# Patient Record
Sex: Male | Born: 1960 | Race: Black or African American | Hispanic: No | Marital: Married | Smoking: Former smoker
Health system: Southern US, Community
[De-identification: ages and names within clinical notes are randomized; demographics above are authoritative.]

## PROBLEM LIST (undated history)

## (undated) DIAGNOSIS — E785 Hyperlipidemia, unspecified: Secondary | ICD-10-CM

## (undated) DIAGNOSIS — N529 Male erectile dysfunction, unspecified: Secondary | ICD-10-CM

## (undated) DIAGNOSIS — E291 Testicular hypofunction: Secondary | ICD-10-CM

## (undated) DIAGNOSIS — I10 Essential (primary) hypertension: Secondary | ICD-10-CM

## (undated) DIAGNOSIS — R351 Nocturia: Secondary | ICD-10-CM

## (undated) DIAGNOSIS — E8881 Metabolic syndrome: Secondary | ICD-10-CM

## (undated) DIAGNOSIS — R972 Elevated prostate specific antigen [PSA]: Secondary | ICD-10-CM

## (undated) DIAGNOSIS — N400 Enlarged prostate without lower urinary tract symptoms: Secondary | ICD-10-CM

## (undated) HISTORY — DX: Metabolic syndrome: E88.810

## (undated) HISTORY — DX: Testicular hypofunction: E29.1

## (undated) HISTORY — DX: Male erectile dysfunction, unspecified: N52.9

## (undated) HISTORY — DX: Hyperlipidemia, unspecified: E78.5

## (undated) HISTORY — PX: VASECTOMY: SHX75

## (undated) HISTORY — DX: Metabolic syndrome: E88.81

## (undated) HISTORY — DX: Benign prostatic hyperplasia without lower urinary tract symptoms: N40.0

## (undated) HISTORY — DX: Essential (primary) hypertension: I10

## (undated) HISTORY — PX: OTHER SURGICAL HISTORY: SHX169

## (undated) HISTORY — PX: FOOT SURGERY: SHX648

## (undated) HISTORY — DX: Nocturia: R35.1

## (undated) HISTORY — DX: Elevated prostate specific antigen (PSA): R97.20

---

## 2009-02-26 ENCOUNTER — Ambulatory Visit: Payer: Self-pay | Admitting: General Surgery

## 2010-10-21 LAB — HM COLONOSCOPY: HM Colonoscopy: NORMAL

## 2011-01-26 IMAGING — CT CT ABD-PELV W/ CM
1 of 3 series · 12 of 32 positions shown, 18 images · non-contrast
Comparison: none

REASON FOR EXAM: LLQ ABD PAIN
COMMENTS:

[Series 2: abdomen · axial · 0.70mm/px · z∈[-1049,-674]mm · 12 of 89 slices shown, 18 images]
[im 7/89  soft-tissue]
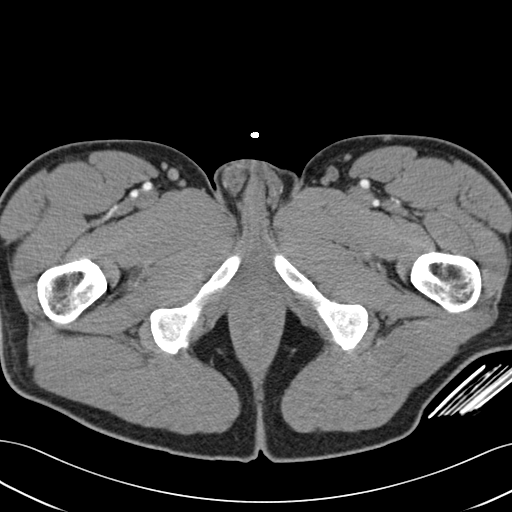
[im 7/89  bone]
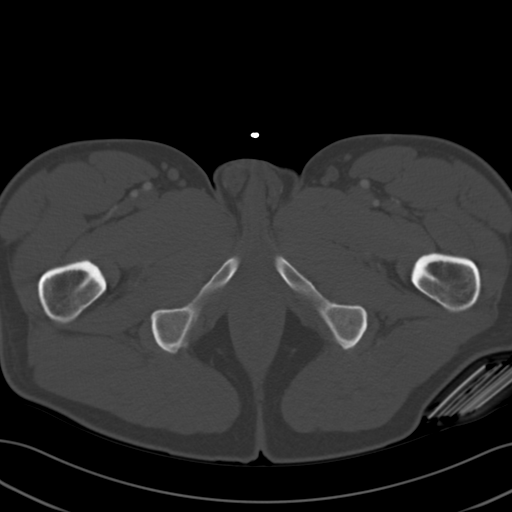
[im 14/89  soft-tissue]
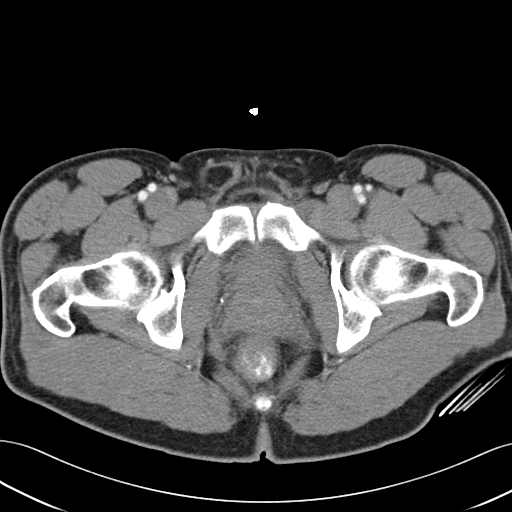
[im 21/89  soft-tissue]
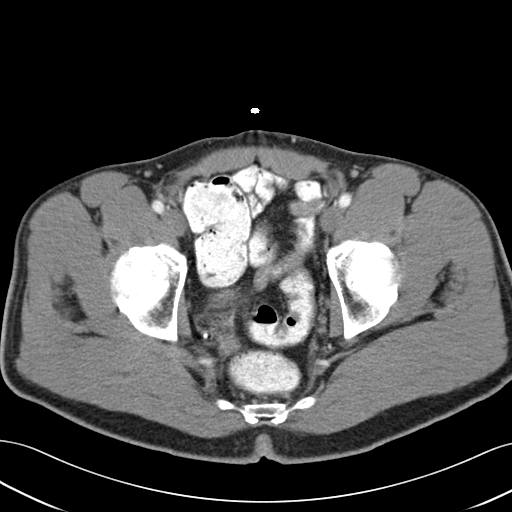
[im 28/89  soft-tissue]
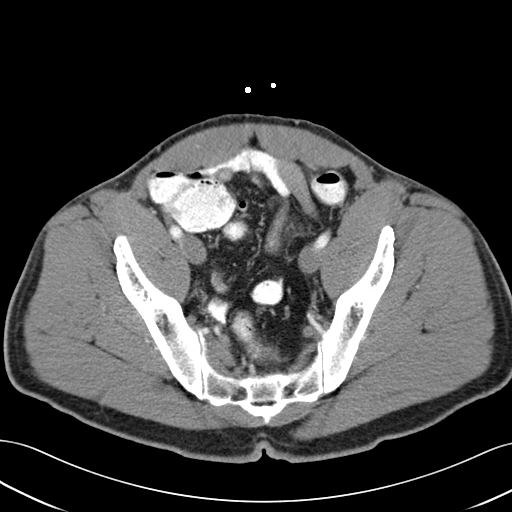
[im 34/89  soft-tissue]
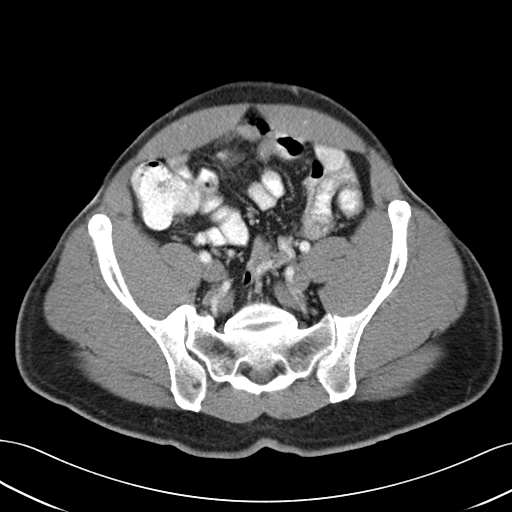
[im 41/89  soft-tissue]
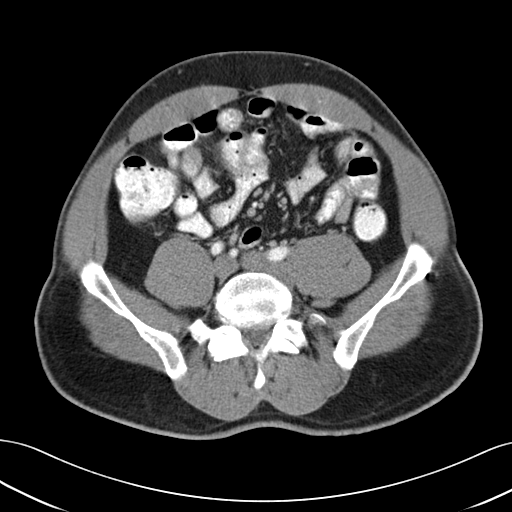
[im 48/89  soft-tissue]
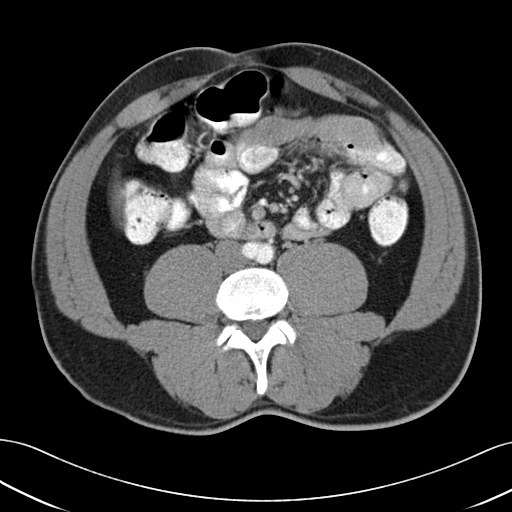
[im 55/89  soft-tissue]
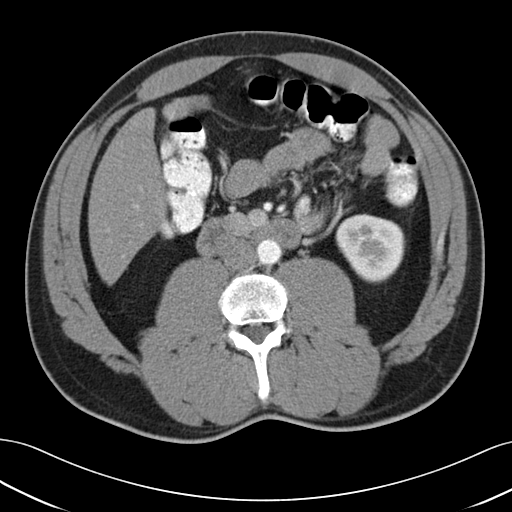
[im 61/89  soft-tissue]
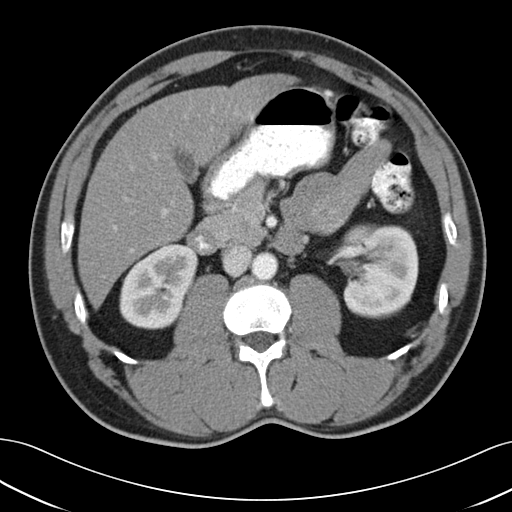
[im 61/89  lung]
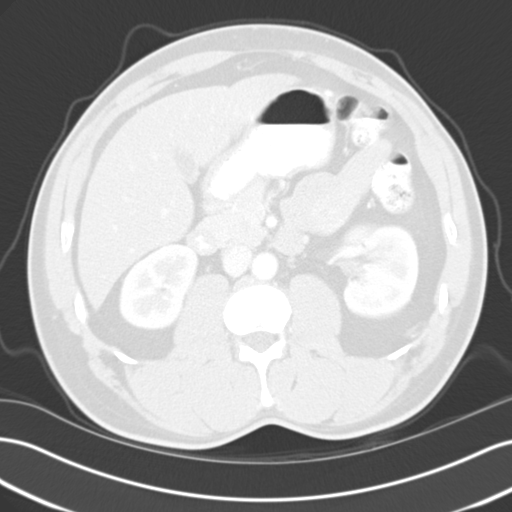
[im 61/89  bone]
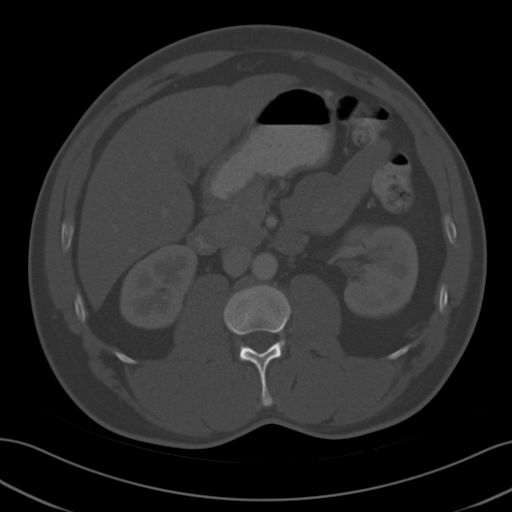
[im 68/89  soft-tissue]
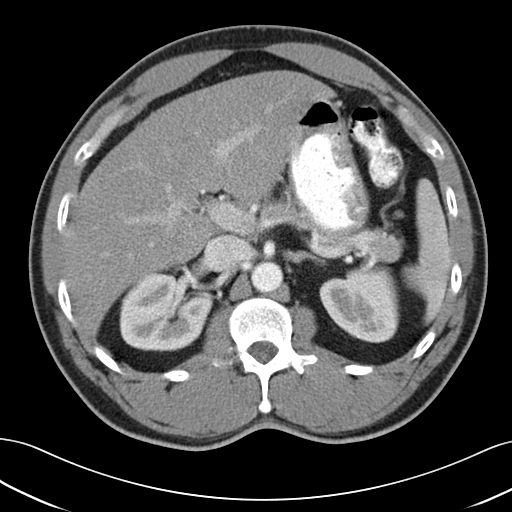
[im 68/89  lung]
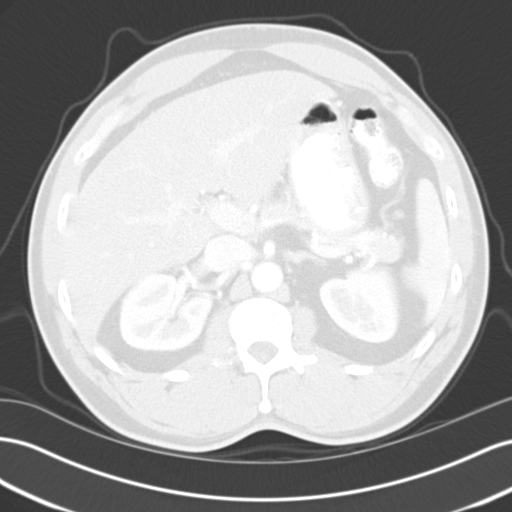
[im 75/89  soft-tissue]
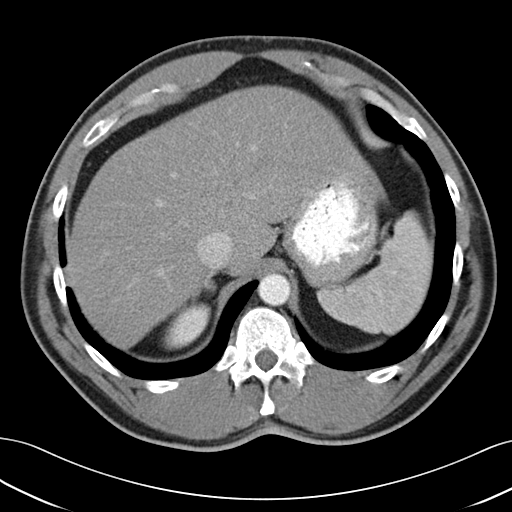
[im 75/89  lung]
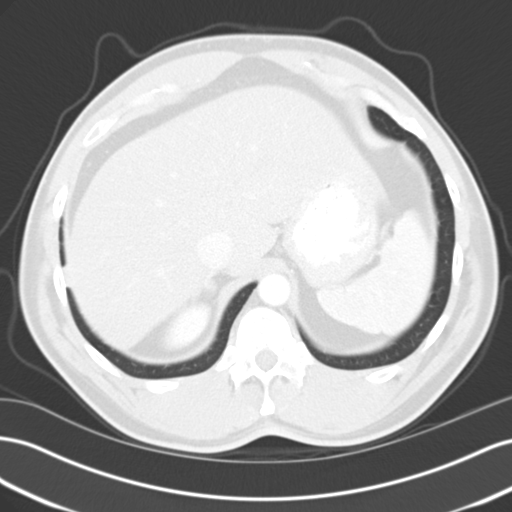
[im 82/89  soft-tissue]
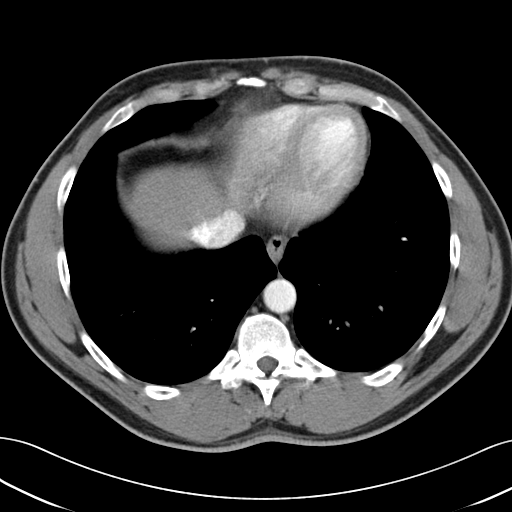
[im 82/89  lung]
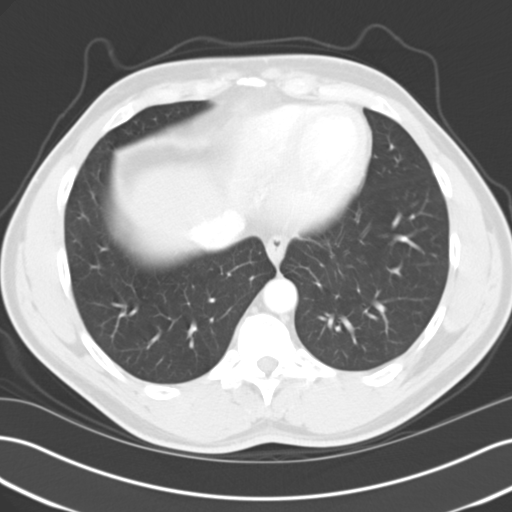

[12 of 32 positions shown; findings below may reference images not displayed]

PROCEDURE:     CT  - CT ABDOMEN / PELVIS  W  - February 26, 2009  [DATE]

RESULT:     Axial CT scanning was performed through the abdomen and pelvis
at 5 mm intervals and slice thicknesses following intravenous administration
of 100 cc of Bsovue-Y95 as well as oral contrast material. Immediate and
delayed images were acquired. Review of 3-dimensional reconstructed images
was separately on the webspace server monitor.

The liver exhibits no ductal dilation or focal mass. There are 5 mm diameter
hypodensities in the right lobe and one in the left lobe seen best on image
21 compatible with cysts. The pancreas, spleen, partially distended stomach,
adrenal glands, and kidneys are normal in appearance. The caliber of the
abdominal aorta is normal. There is no periaortic nor pericaval
lymphadenopathy. The partially distended urinary bladder is normal in
appearance. The prostate gland is mildly enlarged and produces a mild
impression upon the urinary bladder base. The partially distended loops of
small and large bowel exhibit no acute abnormality. There is no evidence of
ascites. The lung bases are clear. The lumbar vertebral bodies are preserved
in height.
IMPRESSION: 1. I do not see evidence of acute diverticulitis nor other acute bowel
abnormality. There is a broadly necked ventral hernia containing fat in the
anterior aspect of a loop of transverse colon but I do not see findings
suspicious for incarceration.
2. I do not see evidence of acute hepatobiliary abnormality nor acute
urinary tract abnormality.
3. There is no evidence of an intra-abdominal or pelvic abscess nor
lymphadenopathy.

## 2013-06-13 LAB — PSA: PSA: 2

## 2013-09-03 ENCOUNTER — Ambulatory Visit: Payer: Self-pay | Admitting: Family Medicine

## 2013-09-03 LAB — BASIC METABOLIC PANEL: Glucose: 94 mg/dL

## 2013-09-03 LAB — HEMOGLOBIN A1C: HEMOGLOBIN A1C: 6.1 % — AB (ref 4.0–6.0)

## 2013-09-14 ENCOUNTER — Ambulatory Visit: Payer: Self-pay | Admitting: Family Medicine

## 2014-01-01 LAB — LIPID PANEL
Cholesterol: 186 mg/dL (ref 0–200)
HDL: 54 mg/dL (ref 35–70)
LDL Cholesterol: 117 mg/dL
Triglycerides: 76 mg/dL (ref 40–160)

## 2014-08-13 ENCOUNTER — Encounter: Payer: Self-pay | Admitting: Family Medicine

## 2014-08-13 ENCOUNTER — Encounter (INDEPENDENT_AMBULATORY_CARE_PROVIDER_SITE_OTHER): Payer: Self-pay

## 2014-08-13 ENCOUNTER — Ambulatory Visit (INDEPENDENT_AMBULATORY_CARE_PROVIDER_SITE_OTHER): Payer: BLUE CROSS/BLUE SHIELD | Admitting: Family Medicine

## 2014-08-13 VITALS — BP 134/76 | HR 62 | Temp 97.9°F | Resp 18 | Ht 69.0 in | Wt 186.0 lb

## 2014-08-13 DIAGNOSIS — E785 Hyperlipidemia, unspecified: Secondary | ICD-10-CM | POA: Insufficient documentation

## 2014-08-13 DIAGNOSIS — B351 Tinea unguium: Secondary | ICD-10-CM | POA: Insufficient documentation

## 2014-08-13 DIAGNOSIS — I10 Essential (primary) hypertension: Secondary | ICD-10-CM | POA: Diagnosis not present

## 2014-08-13 DIAGNOSIS — R739 Hyperglycemia, unspecified: Secondary | ICD-10-CM

## 2014-08-13 DIAGNOSIS — R7309 Other abnormal glucose: Secondary | ICD-10-CM

## 2014-08-13 LAB — GLUCOSE, POCT (MANUAL RESULT ENTRY): POC Glucose: 86 mg/dl (ref 70–99)

## 2014-08-13 MED ORDER — TERBINAFINE HCL 250 MG PO TABS: 250.0000 mg | ORAL_TABLET | Freq: Every day | ORAL | Status: DC

## 2014-08-13 MED ORDER — HYDROCHLOROTHIAZIDE 12.5 MG PO TABS: 12.5000 mg | ORAL_TABLET | Freq: Once | ORAL | Status: DC

## 2014-08-13 MED ORDER — AMLODIPINE-OLMESARTAN 10-40 MG PO TABS: 1.0000 | ORAL_TABLET | Freq: Once | ORAL | Status: DC

## 2014-08-13 NOTE — Addendum Note (Signed)
Addended by: Lilibeth Opie, Ulla Potash on: 08/13/2014 08:07 AM   Modules accepted: Orders

## 2014-08-13 NOTE — Patient Instructions (Signed)
F/u in 3 mo

## 2014-08-13 NOTE — Progress Notes (Signed)
Name: Devon Erickson   MRN: [id number]    DOB: 17-Jul-1960   Date:08/13/2014       Progress Note  Subjective  Chief Complaint  Chief Complaint  Patient presents with  . Hypertension    Hypertension This is a chronic problem. The current episode started more than 1 year ago. The problem is unchanged. The problem is controlled. Associated symptoms include anxiety. Pertinent negatives include no blurred vision, chest pain, headaches, neck pain, orthopnea, palpitations or shortness of breath. There are no associated agents to hypertension. Risk factors for coronary artery disease include male gender. Past treatments include angiotensin blockers, diuretics and calcium channel blockers. The current treatment provides moderate improvement. There are no compliance problems.  Hypertensive end-organ damage includes PVD. There is no history of angina or kidney disease.   TINEA UNGUIM Patient complains of an irritated area on his left thumbnail on the lateral aspect.. There's been no drainage and no bleeding and no history of any trauma. He does not complaining of any toenail involvement.   Past Medical History  Diagnosis Date  . Hypertension   . Hyperlipidemia   . Elevated PSA   . Metabolic syndrome     History  Substance Use Topics  . Smoking status: Former Research scientist (life sciences)  . Smokeless tobacco: Not on file  . Alcohol Use: No     Current outpatient prescriptions:  .  amLODipine-olmesartan (AZOR) 10-40 MG per tablet, Take 1 tablet by mouth., Disp: , Rfl:  .  CIALIS 20 MG tablet, Take 20 mg by mouth as needed., Disp: , Rfl: 12 .  finasteride (PROSCAR) 5 MG tablet, , Disp: , Rfl:  .  fluticasone (FLONASE) 50 MCG/ACT nasal spray, Place 1 spray into the nose., Disp: , Rfl:  .  hydrochlorothiazide (HYDRODIURIL) 12.5 MG tablet, Take 1 tablet by mouth., Disp: , Rfl:   No Known Allergies  Review of Systems  Constitutional: Negative for fever, chills and weight loss.  HENT: Negative for congestion,  hearing loss, sore throat and tinnitus.   Eyes: Negative for blurred vision, double vision and redness.  Respiratory: Negative for cough, hemoptysis and shortness of breath.   Cardiovascular: Negative for chest pain, palpitations, orthopnea, claudication and leg swelling.  Gastrointestinal: Negative for heartburn, nausea, vomiting, diarrhea, constipation and blood in stool.  Genitourinary: Negative for dysuria, urgency, frequency and hematuria.  Musculoskeletal: Negative for myalgias, back pain, joint pain, falls and neck pain.  Skin: Negative for itching.       Inflammation and discomfort in the left thumbnail  Neurological: Negative for dizziness, tingling, tremors, focal weakness, seizures, loss of consciousness, weakness and headaches.  Endo/Heme/Allergies: Does not bruise/bleed easily.  Psychiatric/Behavioral: Negative for depression and substance abuse. The patient is not nervous/anxious and does not have insomnia.      Objective  Filed Vitals:   08/13/14 0734  BP: 134/76  Pulse: 62  Temp: 97.9 F (36.6 C)  TempSrc: Oral  Resp: 18  Height: 5\' 9"  (1.753 m)  Weight: 186 lb (84.369 kg)  SpO2: 97%     Physical Exam  Constitutional: He is oriented to person, place, and time and well-developed, well-nourished, and in no distress.  HENT:  Head: Normocephalic.  Eyes: EOM are normal. Pupils are equal, round, and reactive to light.  Neck: Normal range of motion. Neck supple. No thyromegaly present.  Cardiovascular: Normal rate, regular rhythm and normal heart sounds.   No murmur heard. Pulmonary/Chest: Effort normal and breath sounds normal. No respiratory distress. He has no wheezes.  Abdominal: Soft. Bowel sounds are normal.  Musculoskeletal: Normal range of motion. He exhibits no edema.  Lymphadenopathy:    He has no cervical adenopathy.  Neurological: He is alert and oriented to person, place, and time. No cranial nerve deficit. Gait normal. Coordination normal.  Skin:  Skin is warm and dry. No rash noted.  Thickened scaly aspect of the lateral aspect of the right thumb. This is consistent with a fungal infection.  Psychiatric: Affect and judgment normal.      Assessment & Plan  1. Essential hypertension Well-controlled - POCT Glucose (CBG)  2. Elevated blood sugar Well-controlled - POCT Glucose (CBG)  3. Tinea unguium We'll recheck again in 3 months and repeat another 3 months if not significantly improved - terbinafine (LAMISIL) 250 MG tablet; Take 1 tablet (250 mg total) by mouth daily.  Dispense: 30 tablet; Refill: 2

## 2014-11-14 ENCOUNTER — Ambulatory Visit (INDEPENDENT_AMBULATORY_CARE_PROVIDER_SITE_OTHER): Payer: BLUE CROSS/BLUE SHIELD | Admitting: Family Medicine

## 2014-11-14 ENCOUNTER — Encounter: Payer: Self-pay | Admitting: Family Medicine

## 2014-11-14 VITALS — BP 132/78 | HR 68 | Temp 97.7°F | Resp 16 | Ht 69.0 in | Wt 196.3 lb

## 2014-11-14 DIAGNOSIS — I10 Essential (primary) hypertension: Secondary | ICD-10-CM

## 2014-11-14 DIAGNOSIS — N400 Enlarged prostate without lower urinary tract symptoms: Secondary | ICD-10-CM

## 2014-11-14 MED ORDER — AMLODIPINE-OLMESARTAN 10-40 MG PO TABS: 1.0000 | ORAL_TABLET | Freq: Every day | ORAL | Status: DC

## 2014-11-14 MED ORDER — HYDROCHLOROTHIAZIDE 12.5 MG PO TABS: 12.5000 mg | ORAL_TABLET | Freq: Once | ORAL | Status: DC

## 2014-11-14 NOTE — Progress Notes (Signed)
Name: Devon Erickson   MRN: [id number]    DOB: 1960/04/30   Date:11/14/2014       Progress Note  Subjective  Chief Complaint  Chief Complaint  Patient presents with  . Hypertension    pt here for 3 month follow up  . Hyperlipidemia  . Hyperglycemia    HPI  Hypertension   Patient presents for follow-up of hypertension. It has been present for over 5  years.  Patient states that there is compliance with medical regimen which consists of amlodipine with hydrochlorothiazide and olmesartan. . There is no end organ disease. Cardiac risk factors include hypertension hyperlipidemia and diabetes.  Exercise regimen consist of some walking .  Diet consist of some sodium restriction. Patient .  BPH  Patient has history of BPH for which he is followed by urologist. From his account apparently he is on Proscar on a regular basis. He still has some problems with erections which urologist is following.  Past Medical History  Diagnosis Date  . Hypertension   . Hyperlipidemia   . Elevated PSA   . Metabolic syndrome     Social History  Substance Use Topics  . Smoking status: Former Research scientist (life sciences)  . Smokeless tobacco: Not on file  . Alcohol Use: No     Current outpatient prescriptions:  .  amLODipine-olmesartan (AZOR) 10-40 MG per tablet, Take 1 tablet by mouth once., Disp: 90 tablet, Rfl: 1 .  CIALIS 20 MG tablet, Take 20 mg by mouth as needed., Disp: , Rfl: 12 .  finasteride (PROSCAR) 5 MG tablet, , Disp: , Rfl:  .  hydrochlorothiazide (HYDRODIURIL) 12.5 MG tablet, Take 1 tablet (12.5 mg total) by mouth once., Disp: 90 tablet, Rfl: 1 .  terbinafine (LAMISIL) 250 MG tablet, Take 1 tablet (250 mg total) by mouth daily., Disp: 30 tablet, Rfl: 2 .  amLODipine-olmesartan (AZOR) 10-40 MG per tablet, Take 1 tablet by mouth daily., Disp: 90 tablet, Rfl: 1 .  fluticasone (FLONASE) 50 MCG/ACT nasal spray, Place 1 spray into the nose., Disp: , Rfl:   No Known Allergies  Review of Systems   Constitutional: Negative for fever, chills and weight loss.  HENT: Negative for congestion, hearing loss, sore throat and tinnitus.   Eyes: Negative for blurred vision, double vision and redness.  Respiratory: Negative for cough, hemoptysis and shortness of breath.   Cardiovascular: Negative for chest pain, palpitations, orthopnea, claudication and leg swelling.  Gastrointestinal: Negative for heartburn, nausea, vomiting, diarrhea, constipation and blood in stool.  Genitourinary: Positive for urgency and frequency. Negative for dysuria and hematuria.       Some ADD symptoms  Musculoskeletal: Negative for myalgias, back pain, joint pain, falls and neck pain.  Skin: Negative for itching.  Neurological: Negative for dizziness, tingling, tremors, focal weakness, seizures, loss of consciousness, weakness and headaches.  Endo/Heme/Allergies: Does not bruise/bleed easily.  Psychiatric/Behavioral: Negative for depression and substance abuse. The patient is not nervous/anxious and does not have insomnia.      Objective  Filed Vitals:   11/14/14 0740  BP: 132/78  Pulse: 68  Temp: 97.7 F (36.5 C)  Resp: 16  Height: 5\' 9"  (1.753 m)  Weight: 196 lb 5 oz (89.047 kg)  SpO2: 95%     Physical Exam  Constitutional: He is oriented to person, place, and time and well-developed, well-nourished, and in no distress.  HENT:  Head: Normocephalic.  Eyes: EOM are normal. Pupils are equal, round, and reactive to light.  Neck: Normal range of motion.  Neck supple. No thyromegaly present.  Cardiovascular: Normal rate, regular rhythm and normal heart sounds.   No murmur heard. Pulmonary/Chest: Effort normal and breath sounds normal. No respiratory distress. He has no wheezes.  Musculoskeletal: Normal range of motion. He exhibits no edema.  Lymphadenopathy:    He has no cervical adenopathy.  Neurological: He is alert and oriented to person, place, and time. No cranial nerve deficit. Gait normal.  Coordination normal.  Skin: Skin is warm and dry. No rash noted.  Psychiatric: Affect and judgment normal.      Assessment & Plan  1. Essential hypertension Well-controlled and 81 mg aspirin once daily  2. BPH (benign prostatic hyperplasia) Followed by urologist

## 2014-11-25 ENCOUNTER — Other Ambulatory Visit: Payer: BLUE CROSS/BLUE SHIELD

## 2014-11-25 DIAGNOSIS — R972 Elevated prostate specific antigen [PSA]: Secondary | ICD-10-CM

## 2014-11-26 LAB — PSA: Prostate Specific Ag, Serum: 1.5 ng/mL (ref 0.0–4.0)

## 2014-12-02 ENCOUNTER — Ambulatory Visit (INDEPENDENT_AMBULATORY_CARE_PROVIDER_SITE_OTHER): Payer: BLUE CROSS/BLUE SHIELD | Admitting: Urology

## 2014-12-02 ENCOUNTER — Encounter: Payer: Self-pay | Admitting: *Deleted

## 2014-12-02 VITALS — BP 148/88 | HR 58 | Ht 68.0 in | Wt 194.2 lb

## 2014-12-02 DIAGNOSIS — N529 Male erectile dysfunction, unspecified: Secondary | ICD-10-CM | POA: Insufficient documentation

## 2014-12-02 DIAGNOSIS — N401 Enlarged prostate with lower urinary tract symptoms: Secondary | ICD-10-CM | POA: Diagnosis not present

## 2014-12-02 DIAGNOSIS — N528 Other male erectile dysfunction: Secondary | ICD-10-CM

## 2014-12-02 DIAGNOSIS — N138 Other obstructive and reflux uropathy: Secondary | ICD-10-CM | POA: Insufficient documentation

## 2014-12-02 DIAGNOSIS — Z87898 Personal history of other specified conditions: Secondary | ICD-10-CM | POA: Diagnosis not present

## 2014-12-02 MED ORDER — FINASTERIDE 5 MG PO TABS: 5.0000 mg | ORAL_TABLET | Freq: Every day | ORAL | Status: DC

## 2014-12-02 NOTE — Progress Notes (Signed)
12/02/2014 9:25 AM   Devon Erickson Mar 15, 1960 [id number]  Referring provider: Ashok Norris, MD [address] [place], [place] [address]  Chief Complaint  Patient presents with  . Benign Prostatic Hypertrophy    6 month check up  . Erectile Dysfunction    6 month follow up   . Elevated PSA    bph    HPI: Patient is a 54 year old African-American male with a history of elevated PSA, erectile dysfunction and BPH with LUTS who presents today for 6 month follow-up.  History of elevated PSA Patient was found a PSA of 10.3 ng/mL on 03/11/2012. He underwent a biopsy at that time which was benign. His most recent PSA was 1.5 ng/mL on 11/25/2014.  Erectile dysfunction His SHIM score is 17, which is mild ED.   He has been having difficulty with erections for 3 years.   His major complaint is maintaining an erection.  His libido is preserved.   His risk factors for ED are age, BPH, working third shift and HTN.  He denies any painful erections or curvatures with his erections.   He has tried Cialis in the past, but he finds getting some good rest works just as well.      SHIM      12/02/14 0914       SHIM: Over the last 6 months:   How do you rate your confidence that you could get and keep an erection? Low     When you had erections with sexual stimulation, how often were your erections hard enough for penetration (entering your partner)? Sometimes (about half the time)     During sexual intercourse, how often were you able to maintain your erection after you had penetrated (entered) your partner? Difficult     During sexual intercourse, how difficult was it to maintain your erection to completion of intercourse? Slightly Difficult     When you attempted sexual intercourse, how often was it satisfactory for you? Not Difficult     SHIM Total Score   SHIM 17        Score: 1-7 Severe ED 8-11 Moderate ED 12-16 Mild-Moderate ED 17-21 Mild ED 22-25 No ED   BPH  WITH LUTS His IPSS score today is 6, which is mild lower urinary tract symptomatology. He is pleased with his quality life due to his urinary symptoms. He denies any dysuria, hematuria or suprapubic pain.  He also denies any recent fevers, chills, nausea or vomiting.   He does not have a family history of PCa.      IPSS      12/02/14 0900       International Prostate Symptom Score   How often have you had the sensation of not emptying your bladder? About half the time     How often have you had to urinate less than every two hours? Less than half the time     How often have you found you stopped and started again several times when you urinated? Less than 1 in 5 times     How often have you found it difficult to postpone urination? Not at All     How often have you had a weak urinary stream? Not at All     How often have you had to strain to start urination? Not at All     How many times did you typically get up at night to urinate? None     Total IPSS Score  6     Quality of Life due to urinary symptoms   If you were to spend the rest of your life with your urinary condition just the way it is now how would you feel about that? Mixed        Score:  1-7 Mild 8-19 Moderate 20-35 Severe     PMH: Past Medical History  Diagnosis Date  . Hypertension   . Hyperlipidemia   . Elevated PSA   . Metabolic syndrome   . BPH (benign prostatic hyperplasia)   . Erectile dysfunction   . Nocturia   . Hypogonadism in male     Surgical History: Past Surgical History  Procedure Laterality Date  . Foot surgery Left   . Left leg surgery    . Vasectomy      Home Medications:    Medication List       This list is accurate as of: 12/02/14  9:25 AM.  Always use your most recent med list.               amLODipine-olmesartan 10-40 MG tablet  Commonly known as:  AZOR  Take 1 tablet by mouth once.     amLODipine-olmesartan 10-40 MG tablet  Commonly known as:  AZOR  Take 1 tablet  by mouth daily.     finasteride 5 MG tablet  Commonly known as:  PROSCAR  Take 1 tablet (5 mg total) by mouth daily.     fluticasone 50 MCG/ACT nasal spray  Commonly known as:  FLONASE  Place 1 spray into the nose.     hydrochlorothiazide 12.5 MG tablet  Commonly known as:  HYDRODIURIL  Take 1 tablet (12.5 mg total) by mouth once.     terbinafine 250 MG tablet  Commonly known as:  LAMISIL  Take 1 tablet (250 mg total) by mouth daily.        Allergies: No Known Allergies  Family History: Family History  Problem Relation Age of Onset  . Prostate cancer Neg Hx   . Bladder Cancer Neg Hx     Social History:  reports that he has quit smoking. He does not have any smokeless tobacco history on file. He reports that he does not drink alcohol or use illicit drugs.  ROS: UROLOGY Frequent Urination?: No Hard to postpone urination?: No Burning/pain with urination?: No Get up at night to urinate?: No Leakage of urine?: No Urine stream starts and stops?: No Trouble starting stream?: No Do you have to strain to urinate?: No Blood in urine?: No Urinary tract infection?: No Sexually transmitted disease?: No Injury to kidneys or bladder?: No Painful intercourse?: No Weak stream?: No Erection problems?: No Penile pain?: No  Gastrointestinal Nausea?: No Vomiting?: No Indigestion/heartburn?: No Diarrhea?: No Constipation?: No  Constitutional Fever: No Night sweats?: No Weight loss?: No Fatigue?: No  Skin Skin rash/lesions?: No Itching?: No  Eyes Blurred vision?: No Double vision?: No  Ears/Nose/Throat Sore throat?: No Sinus problems?: No  Hematologic/Lymphatic Swollen glands?: No Easy bruising?: No  Cardiovascular Leg swelling?: No Chest pain?: No  Respiratory Cough?: No Shortness of breath?: No  Endocrine Excessive thirst?: No  Musculoskeletal Back pain?: No Joint pain?: No  Neurological Headaches?: No Dizziness?:  No  Psychologic Depression?: No Anxiety?: No  Physical Exam: BP 148/88 mmHg  Pulse 58  Ht 5\' 8"  (1.727 m)  Wt 194 lb 3.2 oz (88.089 kg)  BMI 29.54 kg/m2  GU: Patient with circumcised phallus.  Urethral meatus is patent.  No penile  discharge. No penile lesions or rashes. Scrotum without lesions, cysts, rashes and/or edema.  Testicles are located scrotally bilaterally. No masses are appreciated in the testicles. Left and right epididymis are normal. Rectal: Patient with  normal sphincter tone. Perineum without scarring or rashes. No rectal masses are appreciated. Prostate is approximately 50 grams, no nodules are appreciated. Seminal vesicles are normal.   Laboratory Data:  PSA History:  10.3 ng/mL on 01/13/2012  bx-neg on 03/09/2012  3.1 ng/mL on 06/15/2012  1.6 ng/mL on 12/15/2012  2.0 ng/mL on 06/13/2013  1.7 ng/mL on 12/24/2013  1.9 ng/mL on 06/13/2014  Lab Results  Component Value Date   PSA 1.5 11/25/2014   PSA 2.0 06/13/2013     Lab Results  Component Value Date   HGBA1C 6.1* 09/03/2013     Assessment & Plan:    1. History of elevated PSA:   Patient was found a PSA of 10.3 ng/mL on 03/11/2012. He underwent a biopsy at that time which was benign. His most recent PSA was 1.5 ng/mL on 11/25/2014.   Patient's PSA levels have reached a steady trend. We will increase visits to yearly at this time.  2. Erectile dysfunction:   Patient is finding that if he gets good rest, it is just as effective as the Cialis.  He desires no other treatment at this time.   3. BPH with LUTS:    Patient's IPSS score is 6/1.   His DRE demonstrates benign enlargement.  Patient would like to continue medical treatment.  I have sent a refill for the finasteride to his pharmacy.    He will follow up in 12 months for a PSA, DRE, PVR and an IPSS score.     Return in about 1 year (around 12/02/2015) for IPSS.  Zara Council, [facility] [address], [place], [place] [address] [phone]

## 2015-01-06 ENCOUNTER — Encounter: Payer: Self-pay | Admitting: Family Medicine

## 2015-01-06 ENCOUNTER — Ambulatory Visit (INDEPENDENT_AMBULATORY_CARE_PROVIDER_SITE_OTHER): Payer: BLUE CROSS/BLUE SHIELD | Admitting: Family Medicine

## 2015-01-06 VITALS — BP 138/80 | HR 78 | Temp 98.5°F | Resp 18 | Ht 68.0 in | Wt 196.6 lb

## 2015-01-06 DIAGNOSIS — Z0000 Encounter for general adult medical examination without abnormal findings: Secondary | ICD-10-CM | POA: Diagnosis not present

## 2015-01-06 NOTE — Progress Notes (Signed)
Name: Devon Erickson   MRN: [id number]    DOB: 04/26/1960   Date:01/06/2015       Progress Note  Subjective  Chief Complaint  Chief Complaint  Patient presents with  . Annual Exam    HPI  54 year old male presenting for annual H&P. Baseline problems are stable  Past Medical History  Diagnosis Date  . Hypertension   . Hyperlipidemia   . Elevated PSA   . Metabolic syndrome   . BPH (benign prostatic hyperplasia)   . Erectile dysfunction   . Nocturia   . Hypogonadism in male     Social History  Substance Use Topics  . Smoking status: Former Research scientist (life sciences)  . Smokeless tobacco: Not on file     Comment: quit 1996  . Alcohol Use: No     Current outpatient prescriptions:  .  amLODipine-olmesartan (AZOR) 10-40 MG per tablet, Take 1 tablet by mouth once., Disp: 90 tablet, Rfl: 1 .  amLODipine-olmesartan (AZOR) 10-40 MG per tablet, Take 1 tablet by mouth daily., Disp: 90 tablet, Rfl: 1 .  finasteride (PROSCAR) 5 MG tablet, Take 1 tablet (5 mg total) by mouth daily., Disp: 90 tablet, Rfl: 3 .  fluticasone (FLONASE) 50 MCG/ACT nasal spray, Place 1 spray into the nose., Disp: , Rfl:  .  hydrochlorothiazide (HYDRODIURIL) 12.5 MG tablet, Take 1 tablet (12.5 mg total) by mouth once., Disp: 90 tablet, Rfl: 1 .  terbinafine (LAMISIL) 250 MG tablet, Take 1 tablet (250 mg total) by mouth daily., Disp: 30 tablet, Rfl: 2  No Known Allergies  Review of Systems  Constitutional: Negative for fever, chills and weight loss.  HENT: Negative for congestion, hearing loss, sore throat and tinnitus.   Eyes: Negative for blurred vision, double vision and redness.  Respiratory: Negative for cough, hemoptysis and shortness of breath.   Cardiovascular: Negative for chest pain, palpitations, orthopnea, claudication and leg swelling.  Gastrointestinal: Negative for heartburn, nausea, vomiting, diarrhea, constipation and blood in stool.  Genitourinary: Positive for frequency. Negative for dysuria, urgency and  hematuria.       Erectile dysfunction  Musculoskeletal: Negative for myalgias, back pain, joint pain, falls and neck pain.  Skin: Negative for itching.  Neurological: Negative for dizziness, tingling, tremors, focal weakness, seizures, loss of consciousness, weakness and headaches.  Endo/Heme/Allergies: Does not bruise/bleed easily.  Psychiatric/Behavioral: Negative for depression and substance abuse. The patient is not nervous/anxious and does not have insomnia.      Objective  Filed Vitals:   01/06/15 0938  BP: 138/80  Pulse: 78  Temp: 98.5 F (36.9 C)  Resp: 18  Height: 5\' 8"  (1.727 m)  Weight: 196 lb 9 oz (89.16 kg)  SpO2: 97%     Physical Exam  Constitutional: He is oriented to person, place, and time and well-developed, well-nourished, and in no distress.  HENT:  Head: Normocephalic.  Eyes: EOM are normal. Pupils are equal, round, and reactive to light.  Neck: Normal range of motion. Neck supple. No thyromegaly present.  Cardiovascular: Normal rate, regular rhythm and normal heart sounds.   No murmur heard. Pulmonary/Chest: Effort normal and breath sounds normal. No respiratory distress. He has no wheezes.  Abdominal: Soft. Bowel sounds are normal.  Genitourinary:  Deferred to urologist  Musculoskeletal: Normal range of motion. He exhibits no edema.  Lymphadenopathy:    He has no cervical adenopathy.  Neurological: He is alert and oriented to person, place, and time. No cranial nerve deficit. Gait normal. Coordination normal.  Skin: Skin is warm  and dry. No rash noted.  Psychiatric: Affect and judgment normal.      Assessment & Plan  1. Annual physical exam  - CBC - Comprehensive Metabolic Panel (CMET) - Lipid Profile - TSH

## 2015-01-07 LAB — LIPID PANEL
Chol/HDL Ratio: 4.3 ratio units (ref 0.0–5.0)
Cholesterol, Total: 231 mg/dL — ABNORMAL HIGH (ref 100–199)
HDL: 54 mg/dL (ref >39)
LDL Calculated: 152 mg/dL — ABNORMAL HIGH (ref 0–99)
Triglycerides: 127 mg/dL (ref 0–149)
VLDL Cholesterol Cal: 25 mg/dL (ref 5–40)

## 2015-01-07 LAB — TSH: TSH: 1.76 u[IU]/mL (ref 0.450–4.500)

## 2015-01-07 LAB — COMPREHENSIVE METABOLIC PANEL
A/G RATIO: 1.6 (ref 1.1–2.5)
ALBUMIN: 4.6 g/dL (ref 3.5–5.5)
ALT: 31 IU/L (ref 0–44)
AST: 24 IU/L (ref 0–40)
Alkaline Phosphatase: 72 IU/L (ref 39–117)
BILIRUBIN TOTAL: 0.5 mg/dL (ref 0.0–1.2)
BUN / CREAT RATIO: 13 (ref 9–20)
BUN: 12 mg/dL (ref 6–24)
CALCIUM: 9.9 mg/dL (ref 8.7–10.2)
CHLORIDE: 94 mmol/L — AB (ref 97–106)
CO2: 28 mmol/L (ref 18–29)
Creatinine, Ser: 0.95 mg/dL (ref 0.76–1.27)
GFR calc Af Amer: 104 mL/min/{1.73_m2} (ref >59)
GFR calc non Af Amer: 90 mL/min/{1.73_m2} (ref >59)
GLOBULIN, TOTAL: 2.9 g/dL (ref 1.5–4.5)
Glucose: 92 mg/dL (ref 65–99)
POTASSIUM: 3.7 mmol/L (ref 3.5–5.2)
Sodium: 137 mmol/L (ref 136–144)
Total Protein: 7.5 g/dL (ref 6.0–8.5)

## 2015-01-07 LAB — CBC
HEMATOCRIT: 41 % (ref 37.5–51.0)
HEMOGLOBIN: 14 g/dL (ref 12.6–17.7)
MCH: 30.5 pg (ref 26.6–33.0)
MCHC: 34.1 g/dL (ref 31.5–35.7)
MCV: 89 fL (ref 79–97)
PLATELETS: 233 10*3/uL (ref 150–379)
RBC: 4.59 x10E6/uL (ref 4.14–5.80)
RDW: 14 % (ref 12.3–15.4)
WBC: 8.5 10*3/uL (ref 3.4–10.8)

## 2015-03-12 ENCOUNTER — Other Ambulatory Visit: Payer: Self-pay | Admitting: Family Medicine

## 2015-03-17 ENCOUNTER — Ambulatory Visit: Payer: BLUE CROSS/BLUE SHIELD | Admitting: Family Medicine

## 2015-04-11 ENCOUNTER — Other Ambulatory Visit: Payer: Self-pay | Admitting: Family Medicine

## 2015-04-11 NOTE — Telephone Encounter (Signed)
Pt needs refill on Azor 10/40 mg, Hydrochlorizide to be sent to [facility] [facility].

## 2015-04-14 MED ORDER — AMLODIPINE-OLMESARTAN 10-40 MG PO TABS: 1.0000 | ORAL_TABLET | Freq: Once | ORAL | Status: DC

## 2015-04-14 MED ORDER — HYDROCHLOROTHIAZIDE 12.5 MG PO TABS: 12.5000 mg | ORAL_TABLET | Freq: Once | ORAL | Status: DC

## 2015-04-14 MED ORDER — AMLODIPINE-OLMESARTAN 10-40 MG PO TABS: 1.0000 | ORAL_TABLET | Freq: Every day | ORAL | Status: DC

## 2015-04-14 NOTE — Addendum Note (Signed)
Addended by: Steele Sizer F on: 04/14/2015 05:10 PM   Modules accepted: Orders

## 2015-04-17 ENCOUNTER — Other Ambulatory Visit: Payer: Self-pay | Admitting: Urology

## 2015-04-17 DIAGNOSIS — N400 Enlarged prostate without lower urinary tract symptoms: Secondary | ICD-10-CM

## 2015-04-18 ENCOUNTER — Emergency Department: Payer: BLUE CROSS/BLUE SHIELD

## 2015-04-18 ENCOUNTER — Emergency Department
Admission: EM | Admit: 2015-04-18 | Discharge: 2015-04-18 | Disposition: A | Discharge status: Home / Self Care | Payer: BLUE CROSS/BLUE SHIELD | Attending: Emergency Medicine | Admitting: Emergency Medicine

## 2015-04-18 DIAGNOSIS — Z79899 Other long term (current) drug therapy: Secondary | ICD-10-CM | POA: Insufficient documentation

## 2015-04-18 DIAGNOSIS — Z87891 Personal history of nicotine dependence: Secondary | ICD-10-CM | POA: Diagnosis not present

## 2015-04-18 DIAGNOSIS — R079 Chest pain, unspecified: Secondary | ICD-10-CM | POA: Insufficient documentation

## 2015-04-18 DIAGNOSIS — I10 Essential (primary) hypertension: Secondary | ICD-10-CM | POA: Insufficient documentation

## 2015-04-18 LAB — CBC
HCT: 41.5 % (ref 40.0–52.0)
Hemoglobin: 13.7 g/dL (ref 13.0–18.0)
MCH: 30.1 pg (ref 26.0–34.0)
MCHC: 33 g/dL (ref 32.0–36.0)
MCV: 91.2 fL (ref 80.0–100.0)
PLATELETS: 205 10*3/uL (ref 150–440)
RBC: 4.55 MIL/uL (ref 4.40–5.90)
RDW: 13.7 % (ref 11.5–14.5)
WBC: 6.3 10*3/uL (ref 3.8–10.6)

## 2015-04-18 LAB — BASIC METABOLIC PANEL
Anion gap: 8 (ref 5–15)
BUN: 16 mg/dL (ref 6–20)
CO2: 23 mmol/L (ref 22–32)
Calcium: 8.4 mg/dL — ABNORMAL LOW (ref 8.9–10.3)
Chloride: 109 mmol/L (ref 101–111)
Creatinine, Ser: 0.85 mg/dL (ref 0.61–1.24)
GFR calc Af Amer: >60 mL/min (ref >60)
GLUCOSE: 103 mg/dL — AB (ref 65–99)
POTASSIUM: 3.7 mmol/L (ref 3.5–5.1)
Sodium: 140 mmol/L (ref 135–145)

## 2015-04-18 LAB — TROPONIN I
Troponin I: <0.03 ng/mL (ref <0.031)
Troponin I: <0.03 ng/mL (ref <0.031)

## 2015-04-18 NOTE — ED Provider Notes (Signed)
[facility] Emergency Department Provider Note  ____________________________________________  Time seen: Approximately 5:37 AM  I have reviewed the triage vital signs and the nursing notes.   HISTORY  Chief Complaint Chest Pain    HPI Devon Erickson is a 55 y.o. male who presents to the ED from home with a chief complaint of chest pain. Patient reports onset of central chest discomfort yesterday approximately 4 PM. Describes pain as "soreness" which is nonradiating and worsened when patient crosses his arms. States he has had a stressful week at work which involves lifting heavy objects. Denies recent fever, chills, shortness of breath, nausea, vomiting, diaphoresis, palpitations, diarrhea. Denies recent travel or trauma. Denies use of hormones.   Past Medical History  Diagnosis Date  . Hypertension   . Hyperlipidemia   . Elevated PSA   . Metabolic syndrome   . BPH (benign prostatic hyperplasia)   . Erectile dysfunction   . Nocturia   . Hypogonadism in male     Patient Active Problem List   Diagnosis Date Noted  . History of elevated PSA 12/02/2014  . BPH with obstruction/lower urinary tract symptoms 12/02/2014  . Erectile dysfunction of organic origin 12/02/2014  . Elevated blood sugar 08/13/2014  . HLD (hyperlipidemia) 08/13/2014  . BP (high blood pressure) 08/13/2014  . Tinea unguium 08/13/2014  . Allergic rhinitis, seasonal 08/04/2009    Past Surgical History  Procedure Laterality Date  . Foot surgery Left   . Left leg surgery    . Vasectomy      Current Outpatient Rx  Name  Route  Sig  Dispense  Refill  . amLODipine-olmesartan (AZOR) 10-40 MG tablet   Oral   Take 1 tablet by mouth once.   90 tablet   0   . finasteride (PROSCAR) 5 MG tablet      TAKE 1 TABLET BY MOUTH EVERY DAY   30 tablet   4   . hydrochlorothiazide (HYDRODIURIL) 12.5 MG tablet   Oral   Take 1 tablet (12.5 mg total) by mouth once.   90 tablet   0      Allergies Review of patient's allergies indicates no known allergies.  Family History  Problem Relation Age of Onset  . Prostate cancer Neg Hx   . Bladder Cancer Neg Hx   None for CAD   Social History Social History  Substance Use Topics  . Smoking status: Former Research scientist (life sciences)  . Smokeless tobacco: Not on file     Comment: quit 1996  . Alcohol Use: No    Review of Systems Constitutional: No fever/chills. Eyes: No visual changes. ENT: No sore throat. Cardiovascular: Positive for chest pain. Respiratory: Denies shortness of breath. Gastrointestinal: No abdominal pain.  No nausea, no vomiting.  No diarrhea.  No constipation. Genitourinary: Negative for dysuria. Musculoskeletal: Negative for back pain. Skin: Negative for rash. Neurological: Negative for headaches, focal weakness or numbness.  10-point ROS otherwise negative.  ____________________________________________   PHYSICAL EXAM:  VITAL SIGNS: ED Triage Vitals  Enc Vitals Group     BP 04/18/15 0349 158/89 mmHg     Pulse Rate 04/18/15 0349 60     Resp 04/18/15 0349 18     Temp 04/18/15 0349 97.8 F (36.6 C)     Temp src --      SpO2 04/18/15 0349 97 %     Weight 04/18/15 0349 210 lb (95.255 kg)     Height 04/18/15 0349 5\' 8"  (1.727 m)  Head Cir --      Peak Flow --      Pain Score 04/18/15 0350 2     Pain Loc --      Pain Edu? --      Excl. in [place]? --     Constitutional: Alert and oriented. Well appearing and in no acute distress. Eyes: Conjunctivae are normal. PERRL. EOMI. Head: Atraumatic. Nose: No congestion/rhinnorhea. Mouth/Throat: Mucous membranes are moist.  Oropharynx non-erythematous. Neck: No stridor.  No carotid bruits. Cardiovascular: Normal rate, regular rhythm. Grossly normal heart sounds.  Good peripheral circulation. Respiratory: Normal respiratory effort.  No retractions. Lungs CTAB. Anterior chest wall tender to palpation. Gastrointestinal: Soft and nontender. No distention. No  abdominal bruits. No CVA tenderness. Musculoskeletal: No lower extremity tenderness nor edema.  Negative Homans sign. No joint effusions. Neurologic:  Normal speech and language. No gross focal neurologic deficits are appreciated. No gait instability. Skin:  Skin is warm, dry and intact. No rash noted. Psychiatric: Mood and affect are normal. Speech and behavior are normal.  ____________________________________________   LABS (all labs ordered are listed, but only abnormal results are displayed)  Labs Reviewed  BASIC METABOLIC PANEL - Abnormal; Notable for the following:    Glucose, Bld 103 (*)    Calcium 8.4 (*)    All other components within normal limits  CBC  TROPONIN I  TROPONIN I   ____________________________________________  EKG  ED ECG REPORT I, Zamira Hickam J, the attending physician, personally viewed and interpreted this ECG.   Date: 04/18/2015  EKG Time: 0349  Rate: 54  Rhythm: sinus bradycardia  Axis: Normal  Intervals:none  ST&T Change: Nonspecific  ____________________________________________  RADIOLOGY  Chest 2 view (viewed by me, interpreted per Dr. Gerilyn Nestle): No active cardiopulmonary disease. ____________________________________________   PROCEDURES  Procedure(s) performed: None  Critical Care performed: No  ____________________________________________   INITIAL IMPRESSION / ASSESSMENT AND PLAN / ED COURSE  Pertinent labs & imaging results that were available during my care of the patient were reviewed by me and considered in my medical decision making (see chart for details).  55 year old male with history of hypertension and hyperlipidemia who presents with 13 hours of constant, central chest soreness which is reproducible on exam. Initial laboratory and imaging results unremarkable. Patient currently rates chest discomfort as 2/10 but declines analgesia. Low suspicion for ACS, PE, dissection; however, given patient's comorbidities will  cycle second troponin.  ----------------------------------------- 7:31 AM on 04/18/2015 -----------------------------------------  Patient resting in no acute distress. Repeat troponin pending. Anticipate discharge home if troponin remains negative. If patient is discharged home, he will follow-up closely with cardiology as an outpatient. Strict return precautions given. Patient verbalizes understanding and agrees with plan of care. Care transferred to Dr. Marcelene Butte pending results of second troponin. ____________________________________________   FINAL CLINICAL IMPRESSION(S) / ED DIAGNOSES  Final diagnoses:  Chest pain, unspecified chest pain type      Paulette Blanch, MD 04/18/15 [id number]

## 2015-04-18 NOTE — ED Notes (Signed)
Pt uprite on stretcher in exam room with no distress noted; reports midsternal CP since yesterday with no radiating pain and no accomp symptoms; resp even/unlab, lungs clear; apical audible & regular

## 2015-04-18 NOTE — ED Notes (Signed)
Pt in with co midsternal chest pain since yest, no hx of heart disease.

## 2015-04-18 NOTE — Discharge Instructions (Signed)
1. Please call the number provided to make an appointment with the heart doctor in the next 1-2 days. 2. Return to the ER for worsening symptoms, persistent vomiting, difficulty breathing or other concerns.  Nonspecific Chest Pain  Chest pain can be caused by many different conditions. There is always a chance that your pain could be related to something serious, such as a heart attack or a blood clot in your lungs. Chest pain can also be caused by conditions that are not life-threatening. If you have chest pain, it is very important to follow up with your health care provider. CAUSES  Chest pain can be caused by:  Heartburn.  Pneumonia or bronchitis.  Anxiety or stress.  Inflammation around your heart (pericarditis) or lung (pleuritis or pleurisy).  A blood clot in your lung.  A collapsed lung (pneumothorax). It can develop suddenly on its own (spontaneous pneumothorax) or from trauma to the chest.  Shingles infection (varicella-zoster virus).  Heart attack.  Damage to the bones, muscles, and cartilage that make up your chest wall. This can include:  Bruised bones due to injury.  Strained muscles or cartilage due to frequent or repeated coughing or overwork.  Fracture to one or more ribs.  Sore cartilage due to inflammation (costochondritis). RISK FACTORS  Risk factors for chest pain may include:  Activities that increase your risk for trauma or injury to your chest.  Respiratory infections or conditions that cause frequent coughing.  Medical conditions or overeating that can cause heartburn.  Heart disease or family history of heart disease.  Conditions or health behaviors that increase your risk of developing a blood clot.  Having had chicken pox (varicella zoster). SIGNS AND SYMPTOMS Chest pain can feel like:  Burning or tingling on the surface of your chest or deep in your chest.  Crushing, pressure, aching, or squeezing pain.  Dull or sharp pain that is  worse when you move, cough, or take a deep breath.  Pain that is also felt in your back, neck, shoulder, or arm, or pain that spreads to any of these areas. Your chest pain may come and go, or it may stay constant. DIAGNOSIS Lab tests or other studies may be needed to find the cause of your pain. Your health care provider may have you take a test called an ambulatory ECG (electrocardiogram). An ECG records your heartbeat patterns at the time the test is performed. You may also have other tests, such as:  Transthoracic echocardiogram (TTE). During echocardiography, sound waves are used to create a picture of all of the heart structures and to look at how blood flows through your heart.  Transesophageal echocardiogram (TEE).This is a more advanced imaging test that obtains images from inside your body. It allows your health care provider to see your heart in finer detail.  Cardiac monitoring. This allows your health care provider to monitor your heart rate and rhythm in real time.  Holter monitor. This is a portable device that records your heartbeat and can help to diagnose abnormal heartbeats. It allows your health care provider to track your heart activity for several days, if needed.  Stress tests. These can be done through exercise or by taking medicine that makes your heart beat more quickly.  Blood tests.  Imaging tests. TREATMENT  Your treatment depends on what is causing your chest pain. Treatment may include:  Medicines. These may include:  Acid blockers for heartburn.  Anti-inflammatory medicine.  Pain medicine for inflammatory conditions.  Antibiotic medicine, if  an infection is present.  Medicines to dissolve blood clots.  Medicines to treat coronary artery disease.  Supportive care for conditions that do not require medicines. This may include:  Resting.  Applying heat or cold packs to injured areas.  Limiting activities until pain decreases. HOME CARE  INSTRUCTIONS  If you were prescribed an antibiotic medicine, finish it all even if you start to feel better.  Avoid any activities that bring on chest pain.  Do not use any tobacco products, including cigarettes, chewing tobacco, or electronic cigarettes. If you need help quitting, ask your health care provider.  Do not drink alcohol.  Take medicines only as directed by your health care provider.  Keep all follow-up visits as directed by your health care provider. This is important. This includes any further testing if your chest pain does not go away.  If heartburn is the cause for your chest pain, you may be told to keep your head raised (elevated) while sleeping. This reduces the chance that acid will go from your stomach into your esophagus.  Make lifestyle changes as directed by your health care provider. These may include:  Getting regular exercise. Ask your health care provider to suggest some activities that are safe for you.  Eating a heart-healthy diet. A registered dietitian can help you to learn healthy eating options.  Maintaining a healthy weight.  Managing diabetes, if necessary.  Reducing stress. SEEK MEDICAL CARE IF:  Your chest pain does not go away after treatment.  You have a rash with blisters on your chest.  You have a fever. SEEK IMMEDIATE MEDICAL CARE IF:   Your chest pain is worse.  You have an increasing cough, or you cough up blood.  You have severe abdominal pain.  You have severe weakness.  You faint.  You have chills.  You have sudden, unexplained chest discomfort.  You have sudden, unexplained discomfort in your arms, back, neck, or jaw.  You have shortness of breath at any time.  You suddenly start to sweat, or your skin gets clammy.  You feel nauseous or you vomit.  You suddenly feel light-headed or dizzy.  Your heart begins to beat quickly, or it feels like it is skipping beats. These symptoms may represent a serious  problem that is an emergency. Do not wait to see if the symptoms will go away. Get medical help right away. Call your local emergency services (911 in the [place] Do not drive yourself to the hospital.   This information is not intended to replace advice given to you by your health care provider. Make sure you discuss any questions you have with your health care provider.   Document Released: 11/25/2004 Document Revised: 03/08/2014 Document Reviewed: 09/21/2013 Elsevier Interactive Patient Education [employer].

## 2015-04-18 NOTE — ED Provider Notes (Signed)
-----------------------------------------   8:31 AM on 04/18/2015 -----------------------------------------   Blood pressure 142/100, pulse 49, temperature 97.8 F (36.6 C), resp. rate 18, height 5\' 8"  (1.727 m), weight 210 lb (95.255 kg), SpO2 96 %.  Assuming care from Dr. Dolphus Jenny.  In short, Devon Erickson is a 55 y.o. male with a chief complaint of Chest Pain .  Refer to the original H&P for additional details.  The current plan of care is to was to follow patient's second troponin. Instructions were discharged of the patient if his troponin was negative. Troponin remains at less than 0.03. Patient will be discharged with scheduled follow-up and instructions.   Daymon Larsen, MD 04/18/15 [phone]

## 2015-05-16 ENCOUNTER — Telehealth: Payer: Self-pay | Admitting: Urology

## 2015-05-16 DIAGNOSIS — N400 Enlarged prostate without lower urinary tract symptoms: Secondary | ICD-10-CM

## 2015-05-16 NOTE — Telephone Encounter (Signed)
Pt needs a refill on is Proscar 5MG . [facility] uses the [facility] [address] [address] in [place].

## 2015-05-19 MED ORDER — FINASTERIDE 5 MG PO TABS: 5.0000 mg | ORAL_TABLET | Freq: Every day | ORAL | Status: DC

## 2015-05-21 ENCOUNTER — Ambulatory Visit: Payer: BLUE CROSS/BLUE SHIELD | Admitting: Family Medicine

## 2015-06-30 ENCOUNTER — Telehealth: Payer: Self-pay | Admitting: Family Medicine

## 2015-06-30 NOTE — Telephone Encounter (Signed)
Requesting refill on Lamisil, Hydrochlorothiazide, and Amlodipine. [facility] to [facility] [address]. Patient had appointment for 07-04-15 but we cancelled because Dr. Rutherford Nail is out of the office.

## 2015-07-04 ENCOUNTER — Ambulatory Visit: Payer: BLUE CROSS/BLUE SHIELD | Admitting: Family Medicine

## 2015-07-04 MED ORDER — AMLODIPINE-OLMESARTAN 10-40 MG PO TABS: 1.0000 | ORAL_TABLET | Freq: Once | ORAL | Status: DC

## 2015-07-04 MED ORDER — HYDROCHLOROTHIAZIDE 12.5 MG PO TABS: 12.5000 mg | ORAL_TABLET | Freq: Once | ORAL | Status: DC

## 2015-07-12 ENCOUNTER — Other Ambulatory Visit: Payer: Self-pay | Admitting: Family Medicine

## 2015-07-14 NOTE — Telephone Encounter (Signed)
Left voice message for patient to return my call to schedule appointment

## 2015-07-29 ENCOUNTER — Other Ambulatory Visit: Payer: Self-pay | Admitting: Emergency Medicine

## 2015-07-29 MED ORDER — HYDROCHLOROTHIAZIDE 12.5 MG PO TABS: 12.5000 mg | ORAL_TABLET | Freq: Every day | ORAL | Status: DC

## 2015-08-10 ENCOUNTER — Other Ambulatory Visit: Payer: Self-pay | Admitting: Urology

## 2015-08-26 ENCOUNTER — Other Ambulatory Visit: Payer: Self-pay

## 2015-08-26 DIAGNOSIS — N400 Enlarged prostate without lower urinary tract symptoms: Secondary | ICD-10-CM

## 2015-08-26 MED ORDER — FINASTERIDE 5 MG PO TABS: 5.0000 mg | ORAL_TABLET | Freq: Every day | ORAL | Status: DC

## 2015-08-28 ENCOUNTER — Ambulatory Visit: Payer: BLUE CROSS/BLUE SHIELD | Admitting: Family Medicine

## 2015-08-28 ENCOUNTER — Other Ambulatory Visit: Payer: Self-pay | Admitting: Family Medicine

## 2015-09-16 ENCOUNTER — Other Ambulatory Visit: Payer: Self-pay

## 2015-09-16 MED ORDER — HYDROCHLOROTHIAZIDE 12.5 MG PO TABS: 12.5000 mg | ORAL_TABLET | Freq: Every day | ORAL | Status: DC

## 2015-09-16 NOTE — Telephone Encounter (Signed)
Got a fax from [facility] stating that this patient must have a 90-day supply.  Refill request was sent to Dr. Steele Sizer for approval and submission.

## 2015-10-08 ENCOUNTER — Ambulatory Visit (INDEPENDENT_AMBULATORY_CARE_PROVIDER_SITE_OTHER): Payer: BLUE CROSS/BLUE SHIELD | Admitting: Family Medicine

## 2015-10-08 ENCOUNTER — Encounter: Payer: Self-pay | Admitting: Family Medicine

## 2015-10-08 VITALS — BP 144/90 | HR 63 | Temp 98.0°F | Resp 15 | Ht 68.0 in | Wt 203.8 lb

## 2015-10-08 DIAGNOSIS — I10 Essential (primary) hypertension: Secondary | ICD-10-CM | POA: Diagnosis not present

## 2015-10-08 MED ORDER — AMLODIPINE-OLMESARTAN 10-40 MG PO TABS: 1.0000 | ORAL_TABLET | Freq: Every day | ORAL | 0 refills | Status: DC

## 2015-10-08 MED ORDER — HYDROCHLOROTHIAZIDE 25 MG PO TABS: 25.0000 mg | ORAL_TABLET | Freq: Every day | ORAL | 0 refills | Status: DC

## 2015-10-08 NOTE — Progress Notes (Signed)
Name: Devon Erickson   MRN: [id number]    DOB: 07/05/1960   Date:10/08/2015       Progress Note  Subjective  Chief Complaint  Chief Complaint  Patient presents with  . Medication Refill  This patient is usually followed by Dr. Rutherford Nail, new to me  Hypertension  This is a chronic problem. The problem is unchanged. The problem is controlled. Pertinent negatives include no blurred vision, chest pain, headaches or palpitations. Past treatments include angiotensin blockers, calcium channel blockers and diuretics. There are no compliance problems.  There is no history of kidney disease, CAD/MI or CVA.    Past Medical History:  Diagnosis Date  . BPH (benign prostatic hyperplasia)   . Elevated PSA   . Erectile dysfunction   . Hyperlipidemia   . Hypertension   . Hypogonadism in male   . Metabolic syndrome   . Nocturia     Past Surgical History:  Procedure Laterality Date  . FOOT SURGERY Left   . Left leg surgery    . VASECTOMY      Family History  Problem Relation Age of Onset  . Prostate cancer Neg Hx   . Bladder Cancer Neg Hx     Social History   Social History  . Marital status: Married    Spouse name: N/A  . Number of children: N/A  . Years of education: N/A   Occupational History  . Not on file.   Social History Main Topics  . Smoking status: Former Research scientist (life sciences)  . Smokeless tobacco: Former Systems developer     Comment: quit 1996  . Alcohol use No  . Drug use: No  . Sexual activity: Yes   Other Topics Concern  . Not on file   Social History Narrative   Caffeine 1 serving daily.     Current Outpatient Prescriptions:  .  amLODipine-olmesartan (AZOR) 10-40 MG tablet, Take 1 tablet by mouth daily., Disp: 90 tablet, Rfl: 0 .  finasteride (PROSCAR) 5 MG tablet, Take 1 tablet (5 mg total) by mouth daily., Disp: 30 tablet, Rfl: 11 .  hydrochlorothiazide (HYDRODIURIL) 25 MG tablet, Take 1 tablet (25 mg total) by mouth daily., Disp: 90 tablet, Rfl: 0  No Known  Allergies   Review of Systems  Eyes: Negative for blurred vision.  Cardiovascular: Negative for chest pain, palpitations and leg swelling.  Neurological: Negative for headaches.    Objective  Vitals:   10/08/15 0757 10/08/15 0823  BP: (!) 143/80 (!) 144/90  Pulse: 63   Resp: 15   Temp: 98 F (36.7 C)   TempSrc: Oral   SpO2: 96%   Weight: 203 lb 12.8 oz (92.4 kg)   Height: 5\' 8"  (1.727 m)     Physical Exam  Constitutional: He is oriented to person, place, and time and well-developed, well-nourished, and in no distress.  HENT:  Head: Normocephalic and atraumatic.  Cardiovascular: Normal rate, regular rhythm and normal heart sounds.   No murmur heard. Pulmonary/Chest: Effort normal and breath sounds normal. He has no wheezes. He has no rales.  Musculoskeletal: He exhibits no edema.  Neurological: He is alert and oriented to person, place, and time.  Psychiatric: Mood, memory, affect and judgment normal.  Nursing note and vitals reviewed.   Assessment & Plan  1. Essential hypertension Blood pressure elevated, confirmed on manual repeat, increase hydrochlorothiazide to 25 mg daily, recheck in 4-6 weeks - hydrochlorothiazide (HYDRODIURIL) 25 MG tablet; Take 1 tablet (25 mg total) by mouth daily.  Dispense: 90  tablet; Refill: 0 - amLODipine-olmesartan (AZOR) 10-40 MG tablet; Take 1 tablet by mouth daily.  Dispense: 90 tablet; Refill: 0   Aissatou Fronczak [facility] 10/08/2015 5:37 PM

## 2015-11-11 ENCOUNTER — Encounter: Payer: Self-pay | Admitting: Family Medicine

## 2015-11-11 ENCOUNTER — Ambulatory Visit (INDEPENDENT_AMBULATORY_CARE_PROVIDER_SITE_OTHER): Payer: BLUE CROSS/BLUE SHIELD | Admitting: Family Medicine

## 2015-11-11 VITALS — BP 138/86 | HR 65 | Temp 97.2°F | Resp 12 | Ht 68.0 in | Wt 194.0 lb

## 2015-11-11 DIAGNOSIS — I10 Essential (primary) hypertension: Secondary | ICD-10-CM

## 2015-11-11 DIAGNOSIS — Z0000 Encounter for general adult medical examination without abnormal findings: Secondary | ICD-10-CM | POA: Insufficient documentation

## 2015-11-11 DIAGNOSIS — Z23 Encounter for immunization: Secondary | ICD-10-CM

## 2015-11-11 DIAGNOSIS — R7309 Other abnormal glucose: Secondary | ICD-10-CM

## 2015-11-11 DIAGNOSIS — R739 Hyperglycemia, unspecified: Secondary | ICD-10-CM

## 2015-11-11 LAB — COMPLETE METABOLIC PANEL WITH GFR
ALT: 48 U/L — AB (ref 9–46)
AST: 27 U/L (ref 10–35)
Albumin: 4.7 g/dL (ref 3.6–5.1)
Alkaline Phosphatase: 74 U/L (ref 40–115)
BUN: 19 mg/dL (ref 7–25)
CALCIUM: 9.7 mg/dL (ref 8.6–10.3)
CHLORIDE: 97 mmol/L — AB (ref 98–110)
CO2: 30 mmol/L (ref 20–31)
Creat: 1 mg/dL (ref 0.70–1.33)
GFR, Est African American: >89 mL/min (ref >=60)
GFR, Est Non African American: 84 mL/min (ref >=60)
Glucose, Bld: 99 mg/dL (ref 65–99)
POTASSIUM: 3.6 mmol/L (ref 3.5–5.3)
SODIUM: 138 mmol/L (ref 135–146)
Total Bilirubin: 0.5 mg/dL (ref 0.2–1.2)
Total Protein: 7.7 g/dL (ref 6.1–8.1)

## 2015-11-11 LAB — LIPID PANEL
CHOL/HDL RATIO: 4.1 ratio (ref <=5.0)
CHOLESTEROL: 216 mg/dL — AB (ref 125–200)
HDL: 53 mg/dL (ref >=40)
LDL CALC: 133 mg/dL — AB (ref <130)
Triglycerides: 152 mg/dL — ABNORMAL HIGH (ref <150)
VLDL: 30 mg/dL (ref <30)

## 2015-11-11 MED ORDER — AMLODIPINE-OLMESARTAN 10-40 MG PO TABS: 1.0000 | ORAL_TABLET | Freq: Every day | ORAL | 0 refills | Status: DC

## 2015-11-11 NOTE — Progress Notes (Signed)
Name: Devon Erickson   MRN: [id number]    DOB: 22-Feb-1961   Date:11/11/2015       Progress Note  Subjective  Chief Complaint  Chief Complaint  Patient presents with  . Annual Exam    CPE    HPI  Pt. Presents for a Complete Physical Exam. He is doing well.   Past Medical History:  Diagnosis Date  . BPH (benign prostatic hyperplasia)   . Elevated PSA   . Erectile dysfunction   . Hyperlipidemia   . Hypertension   . Hypogonadism in male   . Metabolic syndrome   . Nocturia     Past Surgical History:  Procedure Laterality Date  . FOOT SURGERY Left   . Left leg surgery    . VASECTOMY      Family History  Problem Relation Age of Onset  . Prostate cancer Neg Hx   . Bladder Cancer Neg Hx     Social History   Social History  . Marital status: Married    Spouse name: N/A  . Number of children: N/A  . Years of education: N/A   Occupational History  . Not on file.   Social History Main Topics  . Smoking status: Former Research scientist (life sciences)  . Smokeless tobacco: Former Systems developer     Comment: quit 1996  . Alcohol use No  . Drug use: No  . Sexual activity: Yes   Other Topics Concern  . Not on file   Social History Narrative   Caffeine 1 serving daily.     Current Outpatient Prescriptions:  .  amLODipine-olmesartan (AZOR) 10-40 MG tablet, Take 1 tablet by mouth daily., Disp: 90 tablet, Rfl: 0 .  finasteride (PROSCAR) 5 MG tablet, Take 1 tablet (5 mg total) by mouth daily., Disp: 30 tablet, Rfl: 11 .  hydrochlorothiazide (HYDRODIURIL) 25 MG tablet, Take 1 tablet (25 mg total) by mouth daily., Disp: 90 tablet, Rfl: 0  No Known Allergies   Review of Systems  Constitutional: Negative for chills, fever, malaise/fatigue and weight loss.  Eyes: Negative for blurred vision and double vision.  Respiratory: Negative for cough and shortness of breath.   Cardiovascular: Negative for chest pain and leg swelling.  Gastrointestinal: Negative for abdominal pain, blood in stool,  constipation, diarrhea, heartburn, nausea and vomiting.  Genitourinary: Negative for hematuria.  Musculoskeletal: Negative for back pain, joint pain (ankle pain) and myalgias.  Skin: Negative for rash.  Neurological: Negative for dizziness, focal weakness and headaches.  Psychiatric/Behavioral: Negative for depression. The patient is not nervous/anxious.     Objective  Vitals:   11/11/15 0833  BP: 138/86  Pulse: 65  Resp: 12  Temp: 97.2 F (36.2 C)  TempSrc: Oral  SpO2: 95%  Weight: 194 lb (88 kg)  Height: 5\' 8"  (1.727 m)    Physical Exam  Constitutional: He is oriented to person, place, and time and well-developed, well-nourished, and in no distress.  HENT:  Head: Normocephalic and atraumatic.  Right Ear: Tympanic membrane and ear canal normal.  Left Ear: Tympanic membrane and ear canal normal.  Mouth/Throat: No posterior oropharyngeal erythema.  Eyes: Pupils are equal, round, and reactive to light.  Neck: Normal range of motion. Neck supple.  Cardiovascular: Normal rate, regular rhythm and normal heart sounds.   No murmur heard. Pulmonary/Chest: Effort normal and breath sounds normal. He has no wheezes.  Abdominal: Soft. Bowel sounds are normal. There is no tenderness.  Neurological: He is alert and oriented to person, place, and time.  Skin: Skin is warm and dry.  Psychiatric: Mood, memory, affect and judgment normal.  Nursing note and vitals reviewed.      Assessment & Plan  1. Elevated blood sugar  - POCT HgB A1C  2. Annual physical exam  - Lipid Profile - COMPLETE METABOLIC PANEL WITH GFR - TSH - Vitamin D (25 hydroxy) - Hepatitis C Antibody - HIV antibody (with reflex)  3. Essential hypertension  - amLODipine-olmesartan (AZOR) 10-40 MG tablet; Take 1 tablet by mouth daily.  Dispense: 90 tablet; Refill: 0  4. Need for immunization against influenza  - Flu Vaccine QUAD 36+ mos PF IM (Fluarix & Fluzone Quad PF)  Jens Siems [facility] 11/11/2015 8:46 AM

## 2015-11-12 LAB — TSH: TSH: 1.5 m[IU]/L (ref 0.40–4.50)

## 2015-11-12 LAB — HEPATITIS C ANTIBODY: HCV Ab: NEGATIVE

## 2015-11-12 LAB — VITAMIN D 25 HYDROXY (VIT D DEFICIENCY, FRACTURES): VIT D 25 HYDROXY: 28 ng/mL — AB (ref 30–100)

## 2015-11-12 LAB — HIV ANTIBODY (ROUTINE TESTING W REFLEX): HIV: NONREACTIVE

## 2015-11-24 ENCOUNTER — Other Ambulatory Visit: Payer: Self-pay

## 2015-11-24 ENCOUNTER — Other Ambulatory Visit: Payer: BLUE CROSS/BLUE SHIELD

## 2015-11-24 DIAGNOSIS — R972 Elevated prostate specific antigen [PSA]: Secondary | ICD-10-CM

## 2015-11-25 LAB — PSA: Prostate Specific Ag, Serum: 1.6 ng/mL (ref 0.0–4.0)

## 2015-12-02 ENCOUNTER — Encounter: Payer: Self-pay | Admitting: Urology

## 2015-12-02 ENCOUNTER — Ambulatory Visit (INDEPENDENT_AMBULATORY_CARE_PROVIDER_SITE_OTHER): Payer: BLUE CROSS/BLUE SHIELD | Admitting: Urology

## 2015-12-02 VITALS — BP 161/93 | HR 60 | Ht 68.0 in | Wt 201.8 lb

## 2015-12-02 DIAGNOSIS — Z87898 Personal history of other specified conditions: Secondary | ICD-10-CM | POA: Diagnosis not present

## 2015-12-02 DIAGNOSIS — F524 Premature ejaculation: Secondary | ICD-10-CM

## 2015-12-02 DIAGNOSIS — N401 Enlarged prostate with lower urinary tract symptoms: Secondary | ICD-10-CM | POA: Diagnosis not present

## 2015-12-02 MED ORDER — FINASTERIDE 5 MG PO TABS: 5.0000 mg | ORAL_TABLET | Freq: Every day | ORAL | 4 refills | Status: DC

## 2015-12-02 MED ORDER — SILDENAFIL CITRATE 100 MG PO TABS: 100.0000 mg | ORAL_TABLET | Freq: Every day | ORAL | 12 refills | Status: DC | PRN

## 2015-12-02 NOTE — Patient Instructions (Signed)
Take the Viagra two hours prior to intercourse on an empty stomach

## 2015-12-02 NOTE — Progress Notes (Signed)
12/02/2015 9:36 AM   Devon Erickson 14-Jan-1961 [id number]  Referring provider: Ashok Norris, MD [address] [place], [place] [address]  Chief Complaint  Patient presents with  . Benign Prostatic Hypertrophy    1 year follow up  . Elevated PSA    HPI: Patient is a 55 year old African-American male with a history of elevated PSA, erectile dysfunction and BPH with LUTS who presents today for 6 month follow-up.  History of elevated PSA Patient was found a PSA of 10.3 ng/mL on 03/11/2012. He underwent a biopsy at that time which was benign. His most recent PSA was 1.6 ng/mL on 11/25/2015.  Premature ejaculation Patient states that he can remain firm during 35 minutes of foreplay but only lasts for 3 to 5 minutes during intercourse before he ejaculates.  He does not have painful erections or curvature.     BPH WITH LUTS His IPSS score today is 1, which is mild lower urinary tract symptomatology.  He is pleased with his quality life due to his urinary symptoms.  His previous IPSS score was 6/1.  He has no major complaints at today's visit.  He denies any dysuria, hematuria or suprapubic pain.  He also denies any recent fevers, chills, nausea or vomiting.   He does not have a family history of PCa.      IPSS    Row Name 12/02/15 0800         International Prostate Symptom Score   How often have you had the sensation of not emptying your bladder? Not at All     How often have you had to urinate less than every two hours? Not at All     How often have you found you stopped and started again several times when you urinated? Not at All     How often have you found it difficult to postpone urination? Not at All     How often have you had a weak urinary stream? Not at All     How often have you had to strain to start urination? Not at All     How many times did you typically get up at night to urinate? 1 Time     Total IPSS Score 1       Quality of Life due to  urinary symptoms   If you were to spend the rest of your life with your urinary condition just the way it is now how would you feel about that? Mixed        Score:  1-7 Mild 8-19 Moderate 20-35 Severe     PMH: Past Medical History:  Diagnosis Date  . BPH (benign prostatic hyperplasia)   . Elevated PSA   . Erectile dysfunction   . Hyperlipidemia   . Hypertension   . Hypogonadism in male   . Metabolic syndrome   . Nocturia     Surgical History: Past Surgical History:  Procedure Laterality Date  . FOOT SURGERY Left   . Left leg surgery    . VASECTOMY      Home Medications:    Medication List       Accurate as of 12/02/15  9:36 AM. Always use your most recent med list.          amLODipine-olmesartan 10-40 MG tablet Commonly known as:  AZOR Take 1 tablet by mouth daily.   finasteride 5 MG tablet Commonly known as:  PROSCAR Take 1 tablet (5 mg total) by mouth daily.  hydrochlorothiazide 25 MG tablet Commonly known as:  HYDRODIURIL Take 1 tablet (25 mg total) by mouth daily.   sildenafil 100 MG tablet Commonly known as:  VIAGRA Take 1 tablet (100 mg total) by mouth daily as needed for erectile dysfunction. Take two hours prior to intercourse on an empty stomach       Allergies: No Known Allergies  Family History: Family History  Problem Relation Age of Onset  . Prostate cancer Neg Hx   . Bladder Cancer Neg Hx   . Kidney disease Neg Hx     Social History:  reports that he has quit smoking. He has quit using smokeless tobacco. He reports that he does not drink alcohol or use drugs.  ROS: UROLOGY Frequent Urination?: No Hard to postpone urination?: No Burning/pain with urination?: No Get up at night to urinate?: No Leakage of urine?: No Urine stream starts and stops?: No Trouble starting stream?: No Do you have to strain to urinate?: No Blood in urine?: No Urinary tract infection?: No Sexually transmitted disease?: No Injury to kidneys or  bladder?: No Painful intercourse?: No Weak stream?: No Erection problems?: No Penile pain?: No  Gastrointestinal Nausea?: No Vomiting?: No Indigestion/heartburn?: No Diarrhea?: No Constipation?: No  Constitutional Fever: No Night sweats?: No Weight loss?: No Fatigue?: No  Skin Skin rash/lesions?: No Itching?: No  Eyes Blurred vision?: No Double vision?: No  Ears/Nose/Throat Sore throat?: No Sinus problems?: No  Hematologic/Lymphatic Swollen glands?: No Easy bruising?: No  Cardiovascular Leg swelling?: No Chest pain?: No  Respiratory Cough?: No Shortness of breath?: No  Endocrine Excessive thirst?: No  Musculoskeletal Back pain?: No Joint pain?: No  Neurological Headaches?: No Dizziness?: No  Psychologic Depression?: No Anxiety?: No  Physical Exam: BP (!) 161/93   Pulse 60   Ht 5\' 8"  (1.727 m)   Wt 201 lb 12.8 oz (91.5 kg)   BMI 30.68 kg/m   Constitutional: Well nourished. Alert and oriented, No acute distress. HEENT: [place] AT, moist mucus membranes. Trachea midline, no masses. Cardiovascular: No clubbing, cyanosis, or edema. Respiratory: Normal respiratory effort, no increased work of breathing. GI: Abdomen is soft, non tender, non distended, no abdominal masses. Liver and spleen not palpable.  No hernias appreciated.  Stool sample for occult testing is not indicated.   GU: No CVA tenderness.  No bladder fullness or masses.  Patient with circumcised phallus.   Urethral meatus is patent.  No penile discharge. No penile lesions or rashes. Scrotum without lesions, cysts, rashes and/or edema.  Testicles are located scrotally bilaterally. No masses are appreciated in the testicles. Left and right epididymis are normal. Rectal: Patient with  normal sphincter tone. Anus and perineum without scarring or rashes. No rectal masses are appreciated. Prostate is approximately 55 grams, no nodules are appreciated. Seminal vesicles are normal. Skin: No rashes,  bruises or suspicious lesions. Lymph: No cervical or inguinal adenopathy. Neurologic: Grossly intact, no focal deficits, moving all 4 extremities. Psychiatric: Normal mood and affect.  Laboratory Data: PSA History:  10.3 ng/mL on 01/13/2012  bx-neg on 03/09/2012  3.1 ng/mL on 06/15/2012  1.6 ng/mL on 12/15/2012  2.0 ng/mL on 06/13/2013  1.7 ng/mL on 12/24/2013  1.9 ng/mL on 06/13/2014  1.6 ng/mL on 11/24/2015  Lab Results  Component Value Date   PSA 2.0 06/13/2013     Lab Results  Component Value Date   HGBA1C 6.1 (A) 09/03/2013     Assessment & Plan:    1. History of elevated PSA:   Patient  was found a PSA of 10.3 ng/mL on 03/11/2012.   He underwent a biopsy at that time which was benign.   His most recent PSA was 1.6 ng/mL on 11/24/2015.     2. Premature ejaculation:   Patient is counseled on the natural history of premature ejaculation.  Explained we could try PDE5-inhibitors or SSRI.  He is most interested in trying the Viagra.  He is warned not to take the Viagra with medications that contain nitrates.  I also advised him of the side effects, such as: headache, flushing, dyspepsia, abnormal vision, nasal congestion, back pain, myalgia, nausea, dizziness, and rash.  He is to take the medication two hours prior to intercourse on an emptying stomach.  I have given him a script and a coupon.     3. BPH with LUTS  - IPSS score is 1/1, it is stable/improving/worsening  - Continue conservative management, avoiding bladder irritants and timed voiding's  - Continue finasteride 5 mg daily-refills given  - RTC in 6 months for IPSS, PSA and exam    Return in about 6 months (around 06/01/2016) for IPSS, SHIM, PSA and exam.  Zara Council, [facility]  [place] [address], [place], [place] [address] [phone]

## 2016-01-18 ENCOUNTER — Other Ambulatory Visit: Payer: Self-pay | Admitting: Family Medicine

## 2016-01-18 DIAGNOSIS — I10 Essential (primary) hypertension: Secondary | ICD-10-CM

## 2016-03-10 ENCOUNTER — Telehealth: Payer: Self-pay

## 2016-03-10 ENCOUNTER — Other Ambulatory Visit: Payer: Self-pay | Admitting: Family Medicine

## 2016-03-10 DIAGNOSIS — I10 Essential (primary) hypertension: Secondary | ICD-10-CM

## 2016-03-10 MED ORDER — HYDROCHLOROTHIAZIDE 25 MG PO TABS: 25.0000 mg | ORAL_TABLET | Freq: Every day | ORAL | 0 refills | Status: DC

## 2016-03-10 NOTE — Telephone Encounter (Signed)
Medication has been refilled and sent to [facility] [address] [facility] pt has appt scheduled on 03/26/2016

## 2016-03-15 ENCOUNTER — Other Ambulatory Visit: Payer: Self-pay | Admitting: Family Medicine

## 2016-03-15 DIAGNOSIS — I10 Essential (primary) hypertension: Secondary | ICD-10-CM

## 2016-03-24 ENCOUNTER — Telehealth: Payer: Self-pay | Admitting: Urology

## 2016-03-24 DIAGNOSIS — N401 Enlarged prostate with lower urinary tract symptoms: Secondary | ICD-10-CM

## 2016-03-24 MED ORDER — FINASTERIDE 5 MG PO TABS: 5.0000 mg | ORAL_TABLET | Freq: Every day | ORAL | 3 refills | Status: DC

## 2016-03-24 NOTE — Telephone Encounter (Signed)
I have sent it a refill for his finasteride.

## 2016-03-24 NOTE — Telephone Encounter (Signed)
Patient called the office today requesting a refill on his finasteride. [facility] uses the [facility] on [address] in [place], [place].

## 2016-03-26 ENCOUNTER — Encounter: Payer: Self-pay | Admitting: Family Medicine

## 2016-03-26 ENCOUNTER — Ambulatory Visit (INDEPENDENT_AMBULATORY_CARE_PROVIDER_SITE_OTHER): Payer: BLUE CROSS/BLUE SHIELD | Admitting: Family Medicine

## 2016-03-26 VITALS — BP 130/80 | HR 72 | Temp 98.7°F | Resp 18 | Ht 68.0 in | Wt 207.0 lb

## 2016-03-26 DIAGNOSIS — E782 Mixed hyperlipidemia: Secondary | ICD-10-CM

## 2016-03-26 DIAGNOSIS — I10 Essential (primary) hypertension: Secondary | ICD-10-CM | POA: Diagnosis not present

## 2016-03-26 MED ORDER — HYDROCHLOROTHIAZIDE 25 MG PO TABS: 25.0000 mg | ORAL_TABLET | Freq: Every day | ORAL | 0 refills | Status: DC

## 2016-03-26 MED ORDER — ROSUVASTATIN CALCIUM 5 MG PO TABS: 5.0000 mg | ORAL_TABLET | Freq: Every day | ORAL | 0 refills | Status: DC

## 2016-03-26 MED ORDER — AMLODIPINE-OLMESARTAN 10-40 MG PO TABS: 1.0000 | ORAL_TABLET | Freq: Every day | ORAL | 0 refills | Status: DC

## 2016-03-26 NOTE — Progress Notes (Signed)
Name: Devon Erickson   MRN: [id number]    DOB: 1960-08-23   Date:03/26/2016       Progress Note  Subjective  Chief Complaint  Chief Complaint  Patient presents with  . Hypertension  . Hyperlipidemia    Hypertension  This is a chronic problem. The problem is unchanged. The problem is controlled. Pertinent negatives include no blurred vision, chest pain, headaches, orthopnea, palpitations or shortness of breath. Past treatments include calcium channel blockers, angiotensin blockers and diuretics. There is no history of kidney disease, CAD/MI or CVA.  Hyperlipidemia  This is a chronic problem. The problem is uncontrolled. Recent lipid tests were reviewed and are high. Pertinent negatives include no chest pain or shortness of breath. He is currently on no antihyperlipidemic treatment.      Past Medical History:  Diagnosis Date  . BPH (benign prostatic hyperplasia)   . Elevated PSA   . Erectile dysfunction   . Hyperlipidemia   . Hypertension   . Hypogonadism in male   . Metabolic syndrome   . Nocturia     Past Surgical History:  Procedure Laterality Date  . FOOT SURGERY Left   . Left leg surgery    . VASECTOMY      Family History  Problem Relation Age of Onset  . Prostate cancer Neg Hx   . Bladder Cancer Neg Hx   . Kidney disease Neg Hx     Social History   Social History  . Marital status: Married    Spouse name: N/A  . Number of children: N/A  . Years of education: N/A   Occupational History  . Not on file.   Social History Main Topics  . Smoking status: Former Research scientist (life sciences)  . Smokeless tobacco: Former Systems developer     Comment: quit 1996  . Alcohol use No  . Drug use: No  . Sexual activity: Yes   Other Topics Concern  . Not on file   Social History Narrative   Caffeine 1 serving daily.     Current Outpatient Prescriptions:  .  amLODipine-olmesartan (AZOR) 10-40 MG tablet, TAKE 1 TABLET BY MOUTH DAILY, Disp: 90 tablet, Rfl: 0 .  finasteride (PROSCAR) 5 MG  tablet, Take 1 tablet (5 mg total) by mouth daily., Disp: 90 tablet, Rfl: 4 .  finasteride (PROSCAR) 5 MG tablet, Take 1 tablet (5 mg total) by mouth daily., Disp: 90 tablet, Rfl: 3 .  hydrochlorothiazide (HYDRODIURIL) 25 MG tablet, Take 1 tablet (25 mg total) by mouth daily., Disp: 30 tablet, Rfl: 0 .  sildenafil (VIAGRA) 100 MG tablet, Take 1 tablet (100 mg total) by mouth daily as needed for erectile dysfunction. Take two hours prior to intercourse on an empty stomach (Patient not taking: Reported on 03/26/2016), Disp: 6 tablet, Rfl: 12  No Known Allergies   Review of Systems  Eyes: Negative for blurred vision.  Respiratory: Negative for shortness of breath.   Cardiovascular: Negative for chest pain, palpitations and orthopnea.  Neurological: Negative for headaches.      Objective  Vitals:   03/26/16 0913  BP: 130/80  Pulse: 72  Resp: 18  Temp: 98.7 F (37.1 C)  TempSrc: Oral  SpO2: 98%  Weight: 207 lb (93.9 kg)  Height: 5\' 8"  (1.727 m)    Physical Exam  Constitutional: He is oriented to person, place, and time and well-developed, well-nourished, and in no distress.  HENT:  Head: Normocephalic and atraumatic.  Cardiovascular: Normal rate, regular rhythm and normal heart sounds.  No murmur heard. Pulmonary/Chest: Effort normal and breath sounds normal. He has no wheezes. He has no rales.  Abdominal: Soft. Bowel sounds are normal. There is no tenderness.  Musculoskeletal: He exhibits no edema.  Neurological: He is alert and oriented to person, place, and time.  Psychiatric: Mood, memory, affect and judgment normal.  Nursing note and vitals reviewed.     Assessment & Plan  1. Essential hypertension BP stable and controlled on present antihypertensive therapy - hydrochlorothiazide (HYDRODIURIL) 25 MG tablet; Take 1 tablet (25 mg total) by mouth daily.  Dispense: 90 tablet; Refill: 0 - amLODipine-olmesartan (AZOR) 10-40 MG tablet; Take 1 tablet by mouth daily.   Dispense: 90 tablet; Refill: 0  2. Mixed hyperlipidemia Reviewed FLP from September 2017, has elevated total cholesterol, triglycerides, and LDL cholesterol. We'll start on low-dose rosuvastatin, educated on potential adverse effects, reassess in 3 months - rosuvastatin (CRESTOR) 5 MG tablet; Take 1 tablet (5 mg total) by mouth at bedtime.  Dispense: 90 tablet; Refill: 0   Martiza Speth [facility] 03/26/2016 9:14 AM

## 2016-05-10 ENCOUNTER — Ambulatory Visit: Payer: BLUE CROSS/BLUE SHIELD | Admitting: Family Medicine

## 2016-05-26 ENCOUNTER — Other Ambulatory Visit: Payer: BLUE CROSS/BLUE SHIELD

## 2016-05-26 DIAGNOSIS — Z87898 Personal history of other specified conditions: Secondary | ICD-10-CM

## 2016-05-27 LAB — PSA: PROSTATE SPECIFIC AG, SERUM: 1.6 ng/mL (ref 0.0–4.0)

## 2016-05-31 NOTE — Progress Notes (Signed)
06/01/2016 9:07 AM   [name] 27-Dec-1960 [id number]  Referring provider: Roselee Nova, MD [address] [place], [place] [address]  Chief Complaint  Patient presents with  . Benign Prostatic Hypertrophy    6 month follow up   . Elevated PSA    HPI: Patient is a 56 year old African-American male with a history of elevated PSA, erectile dysfunction and BPH with LUTS who presents today for 6 month follow-up.  History of elevated PSA Patient was found a PSA of 10.3 ng/mL on 03/11/2012. He underwent a biopsy at that time which was benign. His most recent PSA was 1.6 ng/mL on 05/26/2016.  Erectile dysfunction His SHIM score is 17, which is mild ED.   He has been having difficulty with erections for several months.   His major complaint is premature ejaculation.  His libido is preserved.   His risk factors for ED are age, BPH, DM, HTN and HLD.  He denies any painful erections or curvatures with his erections.   He is still having spontaneous erections.  He has tried Viagra in the past, but he and his wife do not want to use the medication.        SHIM    Row Name 06/01/16 0857         SHIM: Over the last 6 months:   How do you rate your confidence that you could get and keep an erection? Moderate     When you had erections with sexual stimulation, how often were your erections hard enough for penetration (entering your partner)? Most Times (much more than half the time)     During sexual intercourse, how often were you able to maintain your erection after you had penetrated (entered) your partner? Sometimes (about half the time)     During sexual intercourse, how difficult was it to maintain your erection to completion of intercourse? Difficult     When you attempted sexual intercourse, how often was it satisfactory for you? Most Times (much more than half the time)       SHIM Total Score   SHIM 17        Score: 1-7 Severe ED 8-11 Moderate ED 12-16  Mild-Moderate ED 17-21 Mild ED 22-25 No ED   Premature ejaculation Patient states that he can remain firm during 35 minutes of foreplay but only lasts for 3 to 5 minutes during intercourse before he ejaculates.  He does not have painful erections or curvature.     BPH WITH LUTS His IPSS score today is 1, which is mild lower urinary tract symptomatology.  He is pleased with his quality life due to his urinary symptoms.  His previous IPSS score was 1/1.  He has no major complaints at today's visit.  He denies any dysuria, hematuria or suprapubic pain.  He also denies any recent fevers, chills, nausea or vomiting.   He does not have a family history of PCa.      IPSS    Row Name 06/01/16 0800         International Prostate Symptom Score   How often have you had the sensation of not emptying your bladder? Not at All     How often have you had to urinate less than every two hours? Less than 1 in 5 times     How often have you found you stopped and started again several times when you urinated? Not at All     How often  have you found it difficult to postpone urination? Not at All     How often have you had a weak urinary stream? Not at All     How often have you had to strain to start urination? Not at All     How many times did you typically get up at night to urinate? None     Total IPSS Score 1       Quality of Life due to urinary symptoms   If you were to spend the rest of your life with your urinary condition just the way it is now how would you feel about that? Pleased        Score:  1-7 Mild 8-19 Moderate 20-35 Severe     PMH: Past Medical History:  Diagnosis Date  . BPH (benign prostatic hyperplasia)   . Elevated PSA   . Erectile dysfunction   . Hyperlipidemia   . Hypertension   . Hypogonadism in male   . Metabolic syndrome   . Nocturia     Surgical History: Past Surgical History:  Procedure Laterality Date  . FOOT SURGERY Left   . Left leg surgery    .  VASECTOMY      Home Medications:  Allergies as of 06/01/2016   No Known Allergies     Medication List       Accurate as of 06/01/16  9:07 AM. Always use your most recent med list.          amLODipine-olmesartan 10-40 MG tablet Commonly known as:  AZOR Take 1 tablet by mouth daily.   finasteride 5 MG tablet Commonly known as:  PROSCAR Take 1 tablet (5 mg total) by mouth daily.   hydrochlorothiazide 25 MG tablet Commonly known as:  HYDRODIURIL Take 1 tablet (25 mg total) by mouth daily.   rosuvastatin 5 MG tablet Commonly known as:  CRESTOR Take 1 tablet (5 mg total) by mouth at bedtime.   sildenafil 100 MG tablet Commonly known as:  VIAGRA Take 1 tablet (100 mg total) by mouth daily as needed for erectile dysfunction. Take two hours prior to intercourse on an empty stomach       Allergies: No Known Allergies  Family History: Family History  Problem Relation Age of Onset  . Prostate cancer Neg Hx   . Bladder Cancer Neg Hx   . Kidney disease Neg Hx   . Kidney cancer Neg Hx     Social History:  reports that he has quit smoking. He has quit using smokeless tobacco. He reports that he does not drink alcohol or use drugs.  ROS: UROLOGY Frequent Urination?: No Hard to postpone urination?: No Burning/pain with urination?: No Get up at night to urinate?: No Leakage of urine?: No Urine stream starts and stops?: No Trouble starting stream?: No Do you have to strain to urinate?: No Blood in urine?: No Urinary tract infection?: No Sexually transmitted disease?: No Injury to kidneys or bladder?: No Painful intercourse?: No Weak stream?: No Erection problems?: No Penile pain?: No  Gastrointestinal Nausea?: No Vomiting?: No Indigestion/heartburn?: No Diarrhea?: No Constipation?: No  Constitutional Fever: No Night sweats?: No Weight loss?: No Fatigue?: No  Skin Skin rash/lesions?: No Itching?: No  Eyes Blurred vision?: No Double vision?:  No  Ears/Nose/Throat Sore throat?: No Sinus problems?: No  Hematologic/Lymphatic Swollen glands?: No Easy bruising?: No  Cardiovascular Leg swelling?: No Chest pain?: No  Respiratory Cough?: No Shortness of breath?: No  Endocrine Excessive thirst?: No  Musculoskeletal  Back pain?: No Joint pain?: No  Neurological Headaches?: No Dizziness?: No  Psychologic Depression?: No Anxiety?: No  Physical Exam: BP (!) 148/91   Pulse (!) 56   Ht 5\' 8"  (1.727 m)   Wt 206 lb 4.8 oz (93.6 kg)   BMI 31.37 kg/m   Constitutional: Well nourished. Alert and oriented, No acute distress. HEENT: [place] AT, moist mucus membranes. Trachea midline, no masses. Cardiovascular: No clubbing, cyanosis, or edema. Respiratory: Normal respiratory effort, no increased work of breathing. GI: Abdomen is soft, non tender, non distended, no abdominal masses. Liver and spleen not palpable.  No hernias appreciated.  Stool sample for occult testing is not indicated.   GU: No CVA tenderness.  No bladder fullness or masses.  Patient deferred genital exam. Rectal: Deferred.   Skin: No rashes, bruises or suspicious lesions. Lymph: No cervical or inguinal adenopathy. Neurologic: Grossly intact, no focal deficits, moving all 4 extremities. Psychiatric: Normal mood and affect.  Laboratory Data: PSA History:  10.3 ng/mL on 01/13/2012  bx-neg on 03/09/2012  3.1 ng/mL on 06/15/2012  1.6 ng/mL on 12/15/2012  2.0 ng/mL on 06/13/2013  1.7 ng/mL on 12/24/2013  1.9 ng/mL on 06/13/2014  1.6 ng/mL on 11/24/2015              1.6 ng/mL on 05/26/2016  Lab Results  Component Value Date   PSA 2.0 06/13/2013     Lab Results  Component Value Date   HGBA1C 6.1 (A) 09/03/2013     Assessment & Plan:    1. History of elevated PSA:   Patient was found a PSA of 10.3 ng/mL on 03/11/2012.   He underwent a biopsy at that time which was benign.   His most recent PSA was 1.6 ng/mL on 05/26/2016.       2. Erectile  dysfunction  - SHIM score is 17  - He and his wife are not interested in sex at this time  64. Premature ejaculation:   Patient is no longer concerned with this issue.  4. BPH with LUTS  - IPSS score is 1/1, it is stable  - Continue conservative management, avoiding bladder irritants and timed voiding's  - Continue finasteride 5 mg daily-refills given  - RTC in 6 months for IPSS, PSA and exam    Return in about 6 months (around 12/01/2016) for IPSS, SHIM, PSA and exam.  Zara Council, [facility]  [place] [address], [place], [place] [address] [phone]

## 2016-06-01 ENCOUNTER — Encounter: Payer: Self-pay | Admitting: Urology

## 2016-06-01 ENCOUNTER — Ambulatory Visit (INDEPENDENT_AMBULATORY_CARE_PROVIDER_SITE_OTHER): Payer: BLUE CROSS/BLUE SHIELD | Admitting: Urology

## 2016-06-01 VITALS — BP 148/91 | HR 56 | Ht 68.0 in | Wt 206.3 lb

## 2016-06-01 DIAGNOSIS — F524 Premature ejaculation: Secondary | ICD-10-CM

## 2016-06-01 DIAGNOSIS — N529 Male erectile dysfunction, unspecified: Secondary | ICD-10-CM

## 2016-06-01 DIAGNOSIS — N138 Other obstructive and reflux uropathy: Secondary | ICD-10-CM

## 2016-06-01 DIAGNOSIS — Z87898 Personal history of other specified conditions: Secondary | ICD-10-CM | POA: Diagnosis not present

## 2016-06-01 DIAGNOSIS — N401 Enlarged prostate with lower urinary tract symptoms: Secondary | ICD-10-CM

## 2016-06-01 MED ORDER — FINASTERIDE 5 MG PO TABS: 5.0000 mg | ORAL_TABLET | Freq: Every day | ORAL | 3 refills | Status: DC

## 2016-06-05 ENCOUNTER — Other Ambulatory Visit: Payer: Self-pay | Admitting: Family Medicine

## 2016-06-05 DIAGNOSIS — I10 Essential (primary) hypertension: Secondary | ICD-10-CM

## 2016-06-09 ENCOUNTER — Encounter: Payer: Self-pay | Admitting: Family Medicine

## 2016-06-09 ENCOUNTER — Ambulatory Visit (INDEPENDENT_AMBULATORY_CARE_PROVIDER_SITE_OTHER): Payer: BLUE CROSS/BLUE SHIELD | Admitting: Family Medicine

## 2016-06-09 VITALS — BP 140/80 | HR 70 | Temp 97.8°F | Resp 16 | Ht 68.0 in | Wt 204.5 lb

## 2016-06-09 DIAGNOSIS — E782 Mixed hyperlipidemia: Secondary | ICD-10-CM

## 2016-06-09 DIAGNOSIS — I10 Essential (primary) hypertension: Secondary | ICD-10-CM

## 2016-06-09 DIAGNOSIS — E559 Vitamin D deficiency, unspecified: Secondary | ICD-10-CM | POA: Diagnosis not present

## 2016-06-09 LAB — COMPLETE METABOLIC PANEL WITH GFR
ALT: 43 U/L (ref 9–46)
AST: 31 U/L (ref 10–35)
Albumin: 4.8 g/dL (ref 3.6–5.1)
Alkaline Phosphatase: 77 U/L (ref 40–115)
BUN: 14 mg/dL (ref 7–25)
CALCIUM: 9.6 mg/dL (ref 8.6–10.3)
CHLORIDE: 100 mmol/L (ref 98–110)
CO2: 27 mmol/L (ref 20–31)
Creat: 1.12 mg/dL (ref 0.70–1.33)
GFR, Est African American: 84 mL/min (ref >=60)
GFR, Est Non African American: 73 mL/min (ref >=60)
Glucose, Bld: 110 mg/dL — ABNORMAL HIGH (ref 65–99)
POTASSIUM: 3.8 mmol/L (ref 3.5–5.3)
SODIUM: 137 mmol/L (ref 135–146)
Total Bilirubin: 0.8 mg/dL (ref 0.2–1.2)
Total Protein: 7.8 g/dL (ref 6.1–8.1)

## 2016-06-09 LAB — LIPID PANEL
CHOL/HDL RATIO: 2.8 ratio (ref <5.0)
CHOLESTEROL: 174 mg/dL (ref <200)
HDL: 62 mg/dL (ref >40)
LDL Cholesterol: 100 mg/dL — ABNORMAL HIGH (ref <100)
Triglycerides: 59 mg/dL (ref <150)
VLDL: 12 mg/dL (ref <30)

## 2016-06-09 MED ORDER — HYDROCHLOROTHIAZIDE 25 MG PO TABS: 25.0000 mg | ORAL_TABLET | Freq: Every day | ORAL | 0 refills | Status: AC

## 2016-06-09 MED ORDER — ROSUVASTATIN CALCIUM 5 MG PO TABS: 5.0000 mg | ORAL_TABLET | Freq: Every day | ORAL | 0 refills | Status: DC

## 2016-06-09 MED ORDER — AMLODIPINE-OLMESARTAN 10-40 MG PO TABS: 1.0000 | ORAL_TABLET | Freq: Every day | ORAL | 0 refills | Status: DC

## 2016-06-09 NOTE — Progress Notes (Signed)
Name: Devon Erickson   MRN: [id number]    DOB: 1960-12-21   Date:06/09/2016       Progress Note  Subjective  Chief Complaint  Chief Complaint  Patient presents with  . Follow-up    3 mo  . Medication Refill    Hypertension  This is a chronic problem. The problem is unchanged. The problem is controlled. Pertinent negatives include no blurred vision, chest pain, headaches, orthopnea, palpitations or shortness of breath. Past treatments include calcium channel blockers, angiotensin blockers and diuretics. There is no history of kidney disease, CAD/MI or CVA.  Hyperlipidemia  This is a chronic problem. The problem is uncontrolled. Recent lipid tests were reviewed and are high. Pertinent negatives include no chest pain, leg pain, myalgias or shortness of breath. Current antihyperlipidemic treatment includes statins.     Past Medical History:  Diagnosis Date  . BPH (benign prostatic hyperplasia)   . Elevated PSA   . Erectile dysfunction   . Hyperlipidemia   . Hypertension   . Hypogonadism in male   . Metabolic syndrome   . Nocturia     Past Surgical History:  Procedure Laterality Date  . FOOT SURGERY Left   . Left leg surgery    . VASECTOMY      Family History  Problem Relation Age of Onset  . Prostate cancer Neg Hx   . Bladder Cancer Neg Hx   . Kidney disease Neg Hx   . Kidney cancer Neg Hx     Social History   Social History  . Marital status: Married    Spouse name: N/A  . Number of children: N/A  . Years of education: N/A   Occupational History  . Not on file.   Social History Main Topics  . Smoking status: Former Research scientist (life sciences)  . Smokeless tobacco: Former Systems developer     Comment: quit 1996  . Alcohol use No  . Drug use: No  . Sexual activity: Yes   Other Topics Concern  . Not on file   Social History Narrative   Caffeine 1 serving daily.     Current Outpatient Prescriptions:  .  amLODipine-olmesartan (AZOR) 10-40 MG tablet, Take 1 tablet by mouth daily.,  Disp: 90 tablet, Rfl: 0 .  finasteride (PROSCAR) 5 MG tablet, Take 1 tablet (5 mg total) by mouth daily., Disp: 90 tablet, Rfl: 3 .  hydrochlorothiazide (HYDRODIURIL) 25 MG tablet, Take 1 tablet (25 mg total) by mouth daily., Disp: 90 tablet, Rfl: 0 .  rosuvastatin (CRESTOR) 5 MG tablet, Take 1 tablet (5 mg total) by mouth at bedtime., Disp: 90 tablet, Rfl: 0 .  sildenafil (VIAGRA) 100 MG tablet, Take 1 tablet (100 mg total) by mouth daily as needed for erectile dysfunction. Take two hours prior to intercourse on an empty stomach, Disp: 6 tablet, Rfl: 12  No Known Allergies   Review of Systems  Eyes: Negative for blurred vision.  Respiratory: Negative for shortness of breath.   Cardiovascular: Negative for chest pain, palpitations and orthopnea.  Musculoskeletal: Negative for myalgias.  Neurological: Negative for headaches.    Objective  Vitals:   06/09/16 1020  BP: 140/80  Pulse: 70  Resp: 16  Temp: 97.8 F (36.6 C)  TempSrc: Oral  SpO2: 96%  Weight: 204 lb 8 oz (92.8 kg)  Height: 5\' 8"  (1.727 m)    Physical Exam  Constitutional: He is oriented to person, place, and time and well-developed, well-nourished, and in no distress.  HENT:  Head: Normocephalic and  atraumatic.  Cardiovascular: Normal rate, regular rhythm and normal heart sounds.   No murmur heard. Pulmonary/Chest: Effort normal and breath sounds normal. He has no wheezes. He has no rales.  Abdominal: Soft. Bowel sounds are normal. There is no tenderness.  Musculoskeletal: He exhibits no edema.  Neurological: He is alert and oriented to person, place, and time.  Psychiatric: Mood, memory, affect and judgment normal.  Nursing note and vitals reviewed.    Assessment & Plan  1. Mixed hyperlipidemia  - COMPLETE METABOLIC PANEL WITH GFR - Lipid panel - rosuvastatin (CRESTOR) 5 MG tablet; Take 1 tablet (5 mg total) by mouth at bedtime.  Dispense: 90 tablet; Refill: 0  2. Essential hypertension  -  hydrochlorothiazide (HYDRODIURIL) 25 MG tablet; Take 1 tablet (25 mg total) by mouth daily.  Dispense: 90 tablet; Refill: 0 - amLODipine-olmesartan (AZOR) 10-40 MG tablet; Take 1 tablet by mouth daily.  Dispense: 90 tablet; Refill: 0  3. Vitamin D insufficiency  - VITAMIN D 25 Hydroxy (Vit-D Deficiency, Fractures)   Nai Borromeo Asad A. [facility] 06/09/2016 10:55 AM

## 2016-06-10 ENCOUNTER — Telehealth: Payer: Self-pay

## 2016-06-10 LAB — VITAMIN D 25 HYDROXY (VIT D DEFICIENCY, FRACTURES): Vit D, 25-Hydroxy: 28 ng/mL — ABNORMAL LOW (ref 30–100)

## 2016-06-10 MED ORDER — VITAMIN D (ERGOCALCIFEROL) 1.25 MG (50000 UNIT) PO CAPS: 50000.0000 [IU] | ORAL_CAPSULE | ORAL | 0 refills | Status: DC

## 2016-06-10 NOTE — Telephone Encounter (Signed)
Patient has been notified of lab results and a prescription for vitamin D3 50,000 units take 1 capsule once a week x12 weeks has been sent to [facility] [address] [address] per Dr. Manuella Ghazi, patient has been notified

## 2016-06-24 ENCOUNTER — Ambulatory Visit: Payer: BLUE CROSS/BLUE SHIELD | Admitting: Family Medicine

## 2016-06-28 ENCOUNTER — Other Ambulatory Visit: Payer: Self-pay | Admitting: Family Medicine

## 2016-06-28 DIAGNOSIS — E782 Mixed hyperlipidemia: Secondary | ICD-10-CM

## 2016-06-28 DIAGNOSIS — I10 Essential (primary) hypertension: Secondary | ICD-10-CM

## 2016-07-31 ENCOUNTER — Other Ambulatory Visit: Payer: Self-pay | Admitting: Family Medicine

## 2016-07-31 DIAGNOSIS — I10 Essential (primary) hypertension: Secondary | ICD-10-CM

## 2016-09-09 ENCOUNTER — Ambulatory Visit: Payer: BLUE CROSS/BLUE SHIELD | Admitting: Family Medicine

## 2016-11-11 ENCOUNTER — Encounter: Payer: Self-pay | Admitting: Family Medicine

## 2016-11-11 ENCOUNTER — Ambulatory Visit (INDEPENDENT_AMBULATORY_CARE_PROVIDER_SITE_OTHER): Payer: BLUE CROSS/BLUE SHIELD | Admitting: Family Medicine

## 2016-11-11 VITALS — BP 130/84 | HR 57 | Temp 98.0°F | Resp 16 | Ht 68.0 in | Wt 200.5 lb

## 2016-11-11 DIAGNOSIS — R7303 Prediabetes: Secondary | ICD-10-CM

## 2016-11-11 DIAGNOSIS — E7800 Pure hypercholesterolemia, unspecified: Secondary | ICD-10-CM | POA: Diagnosis not present

## 2016-11-11 DIAGNOSIS — E782 Mixed hyperlipidemia: Secondary | ICD-10-CM

## 2016-11-11 DIAGNOSIS — Z1211 Encounter for screening for malignant neoplasm of colon: Secondary | ICD-10-CM

## 2016-11-11 DIAGNOSIS — E559 Vitamin D deficiency, unspecified: Secondary | ICD-10-CM

## 2016-11-11 DIAGNOSIS — Z0000 Encounter for general adult medical examination without abnormal findings: Secondary | ICD-10-CM

## 2016-11-11 DIAGNOSIS — I10 Essential (primary) hypertension: Secondary | ICD-10-CM | POA: Diagnosis not present

## 2016-11-11 LAB — POCT GLYCOSYLATED HEMOGLOBIN (HGB A1C): Hemoglobin A1C: 6.6

## 2016-11-11 MED ORDER — HYDROCHLOROTHIAZIDE 25 MG PO TABS: 25.0000 mg | ORAL_TABLET | Freq: Every day | ORAL | 0 refills | Status: DC

## 2016-11-11 MED ORDER — AMLODIPINE-OLMESARTAN 10-40 MG PO TABS: 1.0000 | ORAL_TABLET | Freq: Every day | ORAL | 0 refills | Status: AC

## 2016-11-11 MED ORDER — ROSUVASTATIN CALCIUM 5 MG PO TABS: 5.0000 mg | ORAL_TABLET | Freq: Every day | ORAL | 0 refills | Status: AC

## 2016-11-11 NOTE — Progress Notes (Signed)
Name: Devon Erickson   MRN: [id number]    DOB: [birth year]   Date:11/11/2016       Progress Note  Subjective  Chief Complaint  Chief Complaint  Patient presents with  . Annual Exam    HPI  Pt. Presents to obtain a complete physical exam. He follows up with urology for elevated PSA and BPH. He is due for colon cancer screening, last colonoscopy reportedly in 2012  Past Medical History:  Diagnosis Date  . BPH (benign prostatic hyperplasia)   . Elevated PSA   . Erectile dysfunction   . Hyperlipidemia   . Hypertension   . Hypogonadism in male   . Metabolic syndrome   . Nocturia     Past Surgical History:  Procedure Laterality Date  . FOOT SURGERY Left   . Left leg surgery    . VASECTOMY      Family History  Problem Relation Age of Onset  . Prostate cancer Neg Hx   . Bladder Cancer Neg Hx   . Kidney disease Neg Hx   . Kidney cancer Neg Hx     Social History   Social History  . Marital status: Married    Spouse name: N/A  . Number of children: N/A  . Years of education: N/A   Occupational History  . Not on file.   Social History Main Topics  . Smoking status: Former Research scientist (life sciences)  . Smokeless tobacco: Former Systems developer     Comment: quit 1996  . Alcohol use No  . Drug use: No  . Sexual activity: Yes   Other Topics Concern  . Not on file   Social History Narrative   Caffeine 1 serving daily.     Current Outpatient Prescriptions:  .  amLODipine-olmesartan (AZOR) 10-40 MG tablet, Take 1 tablet by mouth daily., Disp: 90 tablet, Rfl: 0 .  finasteride (PROSCAR) 5 MG tablet, Take 1 tablet (5 mg total) by mouth daily., Disp: 90 tablet, Rfl: 3 .  hydrochlorothiazide (HYDRODIURIL) 25 MG tablet, Take 1 tablet (25 mg total) by mouth daily., Disp: 90 tablet, Rfl: 0 .  hydrochlorothiazide (HYDRODIURIL) 25 MG tablet, Take 1 tablet (25 mg total) by mouth daily., Disp: 90 tablet, Rfl: 0 .  rosuvastatin (CRESTOR) 5 MG tablet, Take 1 tablet (5 mg total) by mouth at bedtime.,  Disp: 90 tablet, Rfl: 0 .  Vitamin D, Ergocalciferol, (DRISDOL) 50000 units CAPS capsule, Take 1 capsule (50,000 Units total) by mouth once a week. For 12 weeks, Disp: 12 capsule, Rfl: 0  No Known Allergies   Review of Systems  Constitutional: Negative for chills, fever, malaise/fatigue and weight loss.  HENT: Negative for congestion, ear pain and sore throat.   Eyes: Negative for blurred vision and double vision.  Respiratory: Negative for cough, sputum production, shortness of breath and wheezing.   Cardiovascular: Negative for chest pain and leg swelling.  Gastrointestinal: Negative for abdominal pain, blood in stool, constipation, diarrhea, nausea and vomiting.  Genitourinary: Negative for dysuria and hematuria.  Musculoskeletal: Negative for back pain and neck pain.  Skin: Negative for rash.  Neurological: Negative for dizziness and headaches.  Psychiatric/Behavioral: Negative for depression. The patient is not nervous/anxious and does not have insomnia.       Objective  Vitals:   11/11/16 0832  BP: 130/84  Pulse: (!) 57  Resp: 16  Temp: 98 F (36.7 C)  TempSrc: Oral  SpO2: 98%  Weight: 200 lb 8 oz (90.9 kg)  Height: 5\' 8"  (1.727 m)  Physical Exam  Constitutional: He is oriented to person, place, and time and well-developed, well-nourished, and in no distress.  HENT:  Head: Normocephalic and atraumatic.  Right Ear: External ear normal.  Left Ear: External ear normal.  Mouth/Throat: Oropharynx is clear and moist.  Eyes: Pupils are equal, round, and reactive to light. Conjunctivae are normal.  Neck: Neck supple. No thyromegaly present.  Cardiovascular: Normal rate, regular rhythm and normal heart sounds.   No murmur heard. Pulmonary/Chest: Effort normal and breath sounds normal. He has no wheezes.  Abdominal: Soft. Bowel sounds are normal. There is no tenderness.  Musculoskeletal: He exhibits edema.  Neurological: He is alert and oriented to person, place, and  time.  Psychiatric: Mood, memory, affect and judgment normal.  Nursing note and vitals reviewed.     Assessment & Plan  1. Annual physical exam Obtain age-appropriate laboratory screenings - TSH - CBC with Differential/Platelet  2. Pure hypercholesterolemia On statin, recheck FLP - Lipid panel  3. Vitamin D insufficiency Patient has finished 12 week treatment course of vitamin D, repeat levels - VITAMIN D 25 Hydroxy (Vit-D Deficiency, Fractures)  4. Screening for colon cancer  - Ambulatory referral to Gastroenterology  5. Prediabetes Point-of-care A1c 6.6%, consistent with diabetes, patient will be contacted to schedule an appointment for pharmacotherapy - POCT glycosylated hemoglobin (Hb A1C)  6. Essential hypertension  - amLODipine-olmesartan (AZOR) 10-40 MG tablet; Take 1 tablet by mouth daily.  Dispense: 90 tablet; Refill: 0 - hydrochlorothiazide (HYDRODIURIL) 25 MG tablet; Take 1 tablet (25 mg total) by mouth daily.  Dispense: 90 tablet; Refill: 0  7. Mixed hyperlipidemia  - rosuvastatin (CRESTOR) 5 MG tablet; Take 1 tablet (5 mg total) by mouth at bedtime.  Dispense: 90 tablet; Refill: 0  Toni Hoffmeister Asad A[facility] 11/11/2016 8:59 AM

## 2016-11-12 LAB — CBC WITH DIFFERENTIAL/PLATELET
BASOS PCT: 0.1 %
Basophils Absolute: 7 cells/uL (ref 0–200)
EOS ABS: 98 {cells}/uL (ref 15–500)
Eosinophils Relative: 1.4 %
HEMATOCRIT: 39.5 % (ref 38.5–50.0)
HEMOGLOBIN: 13.2 g/dL (ref 13.2–17.1)
LYMPHS ABS: 1897 {cells}/uL (ref 850–3900)
MCH: 30.1 pg (ref 27.0–33.0)
MCHC: 33.4 g/dL (ref 32.0–36.0)
MCV: 90.2 fL (ref 80.0–100.0)
MPV: 9.7 fL (ref 7.5–12.5)
Monocytes Relative: 8.2 %
Neutro Abs: 4424 cells/uL (ref 1500–7800)
Neutrophils Relative %: 63.2 %
PLATELETS: 218 10*3/uL (ref 140–400)
RBC: 4.38 10*6/uL (ref 4.20–5.80)
RDW: 13.2 % (ref 11.0–15.0)
TOTAL LYMPHOCYTE: 27.1 %
WBC: 7 10*3/uL (ref 3.8–10.8)
WBCMIX: 574 {cells}/uL (ref 200–950)

## 2016-11-12 LAB — LIPID PANEL
CHOLESTEROL: 156 mg/dL (ref <200)
HDL: 53 mg/dL (ref >40)
LDL Cholesterol (Calc): 81 mg/dL (calc)
NON-HDL CHOLESTEROL (CALC): 103 mg/dL (ref <130)
TRIGLYCERIDES: 120 mg/dL (ref <150)
Total CHOL/HDL Ratio: 2.9 (calc) (ref <5.0)

## 2016-11-12 LAB — VITAMIN D 25 HYDROXY (VIT D DEFICIENCY, FRACTURES): Vit D, 25-Hydroxy: 40 ng/mL (ref 30–100)

## 2016-11-12 LAB — TSH: TSH: 1 mIU/L (ref 0.40–4.50)

## 2016-11-14 ENCOUNTER — Other Ambulatory Visit: Payer: Self-pay | Admitting: Family Medicine

## 2016-11-22 ENCOUNTER — Telehealth: Payer: Self-pay | Admitting: Urology

## 2016-11-22 DIAGNOSIS — N529 Male erectile dysfunction, unspecified: Secondary | ICD-10-CM

## 2016-11-22 MED ORDER — FINASTERIDE 5 MG PO TABS: 5.0000 mg | ORAL_TABLET | Freq: Every day | ORAL | 0 refills | Status: DC

## 2016-11-22 NOTE — Telephone Encounter (Signed)
30 days and no refills given to ensure pt keeps his appt in Oct.

## 2016-11-22 NOTE — Telephone Encounter (Signed)
Pt called office stating he is out of his medication finesteride and would like another Rx called to [place] pharmacy, [address]. Please advise. Thanks

## 2016-11-26 ENCOUNTER — Telehealth: Payer: Self-pay | Admitting: Urology

## 2016-11-26 ENCOUNTER — Other Ambulatory Visit: Payer: BLUE CROSS/BLUE SHIELD

## 2016-11-26 DIAGNOSIS — Z87898 Personal history of other specified conditions: Secondary | ICD-10-CM

## 2016-11-26 NOTE — Progress Notes (Signed)
11/29/2016 8:41 AM   Devon Erickson [birth year] [id number]  Referring provider: Roselee Nova, MD [address] [place], [place] [address]  Chief Complaint  Patient presents with  . Elevated PSA    6 month follow up  . Benign Prostatic Hypertrophy  . Erectile Dysfunction    HPI: Patient is a 56 year old African-American male with a history of elevated PSA, erectile dysfunction and BPH with LUTS who presents today for 6 month follow-up.  History of elevated PSA Patient was found a PSA of 10.3 ng/mL on 03/11/2012. He underwent a biopsy at that time which was benign. His most recent PSA was 2.1 ng/mL on 11/26/2016.   He was out of the finasteride for one week.    Erectile dysfunction His SHIM score is 7, which is severe ED.   He has been having difficulty with erections for several months.   His previous SHIM was 17.  His major complaint is premature ejaculation.  His libido is preserved.   His risk factors for ED are age, BPH, DM, HTN and HLD.  He denies any painful erections or curvatures with his erections.   He is still having spontaneous erections.  He has tried Viagra in the past, but he and his wife do not want to use the medication.   He is interested in trying the Cialis 20 mg on demand dosing hoping for a more spontaneous effect.      SHIM    Row Name 11/29/16 5427         SHIM: Over the last 6 months:   How do you rate your confidence that you could get and keep an erection? Very Low     When you had erections with sexual stimulation, how often were your erections hard enough for penetration (entering your partner)? Almost Never or Never     During sexual intercourse, how often were you able to maintain your erection after you had penetrated (entered) your partner? Almost Never or Never     During sexual intercourse, how difficult was it to maintain your erection to completion of intercourse? Extremely Difficult     When you attempted sexual  intercourse, how often was it satisfactory for you? Sometimes (about half the time)       SHIM Total Score   SHIM 7        Score: 1-7 Severe ED 8-11 Moderate ED 12-16 Mild-Moderate ED 17-21 Mild ED 22-25 No ED   Premature ejaculation Patient states that he can remain firm during 35 minutes of foreplay but only lasts for 3 to 5 minutes during intercourse before he ejaculates.  He does not have painful erections or curvature.     BPH WITH LUTS His IPSS score today is 0, which is no lower urinary tract symptomatology.  He is mostly satisfied with his quality life due to his urinary symptoms.  His previous IPSS score was 1/2.  He has no major complaints at today's visit.  He denies any dysuria, hematuria or suprapubic pain.  He also denies any recent fevers, chills, nausea or vomiting.   He does not have a family history of PCa.      IPSS    Row Name 11/29/16 0800         International Prostate Symptom Score   How often have you had the sensation of not emptying your bladder? Not at All     How often have you had to urinate less than every two  hours? Not at All     How often have you found you stopped and started again several times when you urinated? Not at All     How often have you found it difficult to postpone urination? Not at All     How often have you had a weak urinary stream? Not at All     How often have you had to strain to start urination? Not at All     How many times did you typically get up at night to urinate? None     Total IPSS Score 0       Quality of Life due to urinary symptoms   If you were to spend the rest of your life with your urinary condition just the way it is now how would you feel about that? Mostly Satisfied        Score:  1-7 Mild 8-19 Moderate 20-35 Severe     PMH: Past Medical History:  Diagnosis Date  . BPH (benign prostatic hyperplasia)   . Elevated PSA   . Erectile dysfunction   . Hyperlipidemia   . Hypertension   .  Hypogonadism in male   . Metabolic syndrome   . Nocturia     Surgical History: Past Surgical History:  Procedure Laterality Date  . FOOT SURGERY Left   . Left leg surgery    . VASECTOMY      Home Medications:  Allergies as of 11/29/2016   No Known Allergies     Medication List       Accurate as of 11/29/16  8:41 AM. Always use your most recent med list.          amLODipine-olmesartan 10-40 MG tablet Commonly known as:  AZOR Take 1 tablet by mouth daily.   finasteride 5 MG tablet Commonly known as:  PROSCAR Take 1 tablet (5 mg total) by mouth daily.   hydrochlorothiazide 25 MG tablet Commonly known as:  HYDRODIURIL Take 1 tablet (25 mg total) by mouth daily.   hydrochlorothiazide 25 MG tablet Commonly known as:  HYDRODIURIL Take 1 tablet (25 mg total) by mouth daily.   rosuvastatin 5 MG tablet Commonly known as:  CRESTOR Take 1 tablet (5 mg total) by mouth at bedtime.   tadalafil 20 MG tablet Commonly known as:  CIALIS Take 1 tablet (20 mg total) by mouth daily as needed for erectile dysfunction.   Vitamin D (Ergocalciferol) 50000 units Caps capsule Commonly known as:  DRISDOL Take 1 capsule (50,000 Units total) by mouth once a week. For 12 weeks            Discharge Care Instructions        Start     Ordered   11/29/16 0000  PSA     11/29/16 0800   11/29/16 0000  tadalafil (CIALIS) 20 MG tablet  Daily PRN    Question:  Supervising Provider  Answer:  Hollice Espy   11/29/16 0834   11/29/16 0000  finasteride (PROSCAR) 5 MG tablet  Daily    Question:  Supervising Provider  Answer:  Hollice Espy   11/29/16 0835      Allergies: No Known Allergies  Family History: Family History  Problem Relation Age of Onset  . Prostate cancer Neg Hx   . Bladder Cancer Neg Hx   . Kidney disease Neg Hx   . Kidney cancer Neg Hx     Social History:  reports that he has quit smoking. He has quit using  smokeless tobacco. He reports that he does not drink  alcohol or use drugs.  ROS: UROLOGY Frequent Urination?: No Hard to postpone urination?: No Burning/pain with urination?: No Get up at night to urinate?: No Leakage of urine?: No Urine stream starts and stops?: No Trouble starting stream?: No Do you have to strain to urinate?: No Blood in urine?: No Urinary tract infection?: No Sexually transmitted disease?: No Injury to kidneys or bladder?: No Painful intercourse?: No Weak stream?: No Erection problems?: No Penile pain?: No  Gastrointestinal Nausea?: No Vomiting?: No Indigestion/heartburn?: No Diarrhea?: No Constipation?: No  Constitutional Fever: No Night sweats?: No Weight loss?: No Fatigue?: No  Skin Skin rash/lesions?: No Itching?: No  Eyes Blurred vision?: No Double vision?: No  Ears/Nose/Throat Sore throat?: No Sinus problems?: No  Hematologic/Lymphatic Swollen glands?: No Easy bruising?: No  Cardiovascular Leg swelling?: No Chest pain?: No  Respiratory Cough?: No Shortness of breath?: No  Endocrine Excessive thirst?: No  Musculoskeletal Back pain?: No Joint pain?: No  Neurological Headaches?: No Dizziness?: No  Psychologic Depression?: No Anxiety?: No  Physical Exam: BP (!) 158/88   Pulse (!) 58   Ht 5\' 8"  (1.727 m)   Wt 198 lb 8 oz (90 kg)   BMI 30.18 kg/m   Constitutional: Well nourished. Alert and oriented, No acute distress. HEENT: [place] AT, moist mucus membranes. Trachea midline, no masses. Cardiovascular: No clubbing, cyanosis, or edema. Respiratory: Normal respiratory effort, no increased work of breathing. GI: Abdomen is soft, non tender, non distended, no abdominal masses. Liver and spleen not palpable.  No hernias appreciated.  Stool sample for occult testing is not indicated.   GU: No CVA tenderness.  No bladder fullness or masses.  Patient with circumcised phallus.  Urethral meatus is patent.  No penile discharge. No penile lesions or rashes. Scrotum without  lesions, cysts, rashes and/or edema.  Testicles are located scrotally bilaterally. No masses are appreciated in the testicles. Left and right epididymis are normal. Rectal: Patient with  normal sphincter tone. Anus and perineum without scarring or rashes. No rectal masses are appreciated. Prostate is approximately 45 grams, no nodules are appreciated. Seminal vesicles are normal. Skin: No rashes, bruises or suspicious lesions. Lymph: No cervical or inguinal adenopathy. Neurologic: Grossly intact, no focal deficits, moving all 4 extremities. Psychiatric: Normal mood and affect.  Laboratory Data: PSA History:  10.3 ng/mL on 01/13/2012  bx-neg on 03/09/2012  3.1 ng/mL on 06/15/2012  1.6 ng/mL on 12/15/2012  2.0 ng/mL on 06/13/2013  1.7 ng/mL on 12/24/2013  1.9 ng/mL on 06/13/2014  1.6 ng/mL on 11/24/2015              1.6 ng/mL on 05/26/2016  2.1 ng/mL on 11/26/2016  Lab Results  Component Value Date   PSA 2.0 06/13/2013     Lab Results  Component Value Date   HGBA1C 6.6 11/11/2016   I have reviewed the labs  Assessment & Plan:    1. History of elevated PSA:   Patient was found a PSA of 10.3 ng/mL on 03/11/2012.   He underwent a biopsy at that time which was benign.   His most recent PSA was 2.1 ng/mL on 11/26/2016.    He has been without finasteride for one week.  PSA is repeated today.    2. Erectile dysfunction  - SHIM score is 7, it is worsening  - script given for Cialis 20 mg on demand dosing  - RTC in 6 months for SHIM and exam  3.  Premature ejaculation:   Patient is still no longer concerned with this issue.  4. BPH with LUTS  - IPSS score is 0/2, it is stable  - Continue conservative management, avoiding bladder irritants and timed voiding's  - Continue finasteride 5 mg daily-refills given  - RTC in 6 months for IPSS, PSA and exam    Return in about 6 months (around 05/30/2017) for IPSS, SHIM, PSA and exam.  Zara Council, [facility]  [place] [address] [place], [place] [address] [phone]

## 2016-11-26 NOTE — Telephone Encounter (Signed)
Patient came in to day for his labs and asked if he could have a hand written script for his finasteride because when we escribe it he has problems. Dr. Pilar Jarvis ok it and wrote him one. Patient has a follow up app with you on 11-29-16.   Sharyn Lull

## 2016-11-27 LAB — PSA: Prostate Specific Ag, Serum: 2.1 ng/mL (ref 0.0–4.0)

## 2016-11-29 ENCOUNTER — Encounter: Payer: Self-pay | Admitting: Urology

## 2016-11-29 ENCOUNTER — Ambulatory Visit (INDEPENDENT_AMBULATORY_CARE_PROVIDER_SITE_OTHER): Payer: BLUE CROSS/BLUE SHIELD | Admitting: Urology

## 2016-11-29 VITALS — BP 158/88 | HR 58 | Ht 68.0 in | Wt 198.5 lb

## 2016-11-29 DIAGNOSIS — N401 Enlarged prostate with lower urinary tract symptoms: Secondary | ICD-10-CM | POA: Diagnosis not present

## 2016-11-29 DIAGNOSIS — F524 Premature ejaculation: Secondary | ICD-10-CM

## 2016-11-29 DIAGNOSIS — N138 Other obstructive and reflux uropathy: Secondary | ICD-10-CM | POA: Diagnosis not present

## 2016-11-29 DIAGNOSIS — Z87898 Personal history of other specified conditions: Secondary | ICD-10-CM | POA: Diagnosis not present

## 2016-11-29 DIAGNOSIS — N529 Male erectile dysfunction, unspecified: Secondary | ICD-10-CM | POA: Diagnosis not present

## 2016-11-29 MED ORDER — FINASTERIDE 5 MG PO TABS: 5.0000 mg | ORAL_TABLET | Freq: Every day | ORAL | 3 refills | Status: DC

## 2016-11-29 MED ORDER — TADALAFIL 20 MG PO TABS: 20.0000 mg | ORAL_TABLET | Freq: Every day | ORAL | 11 refills | Status: DC | PRN

## 2016-11-30 ENCOUNTER — Telehealth: Payer: Self-pay

## 2016-11-30 LAB — PSA: Prostate Specific Ag, Serum: 2 ng/mL (ref 0.0–4.0)

## 2016-11-30 NOTE — Telephone Encounter (Signed)
-----   Message from Nori Riis, PA-C sent at 11/30/2016  8:41 AM EDT ----- Please let Devon Erickson know that his PSA is still at 2.0.  I would like to check his PSA again in 3 months to make sure it doesn't get higher while he is on the finasteride.  This could mean that he might have prostate cancer if it continues to rise.

## 2016-11-30 NOTE — Telephone Encounter (Signed)
Left pt mess to call 

## 2016-11-30 NOTE — Telephone Encounter (Signed)
Spoke with pt in reference PSA results and needing to have drawn again in 69mo. Pt stated that he would call back to make appt.

## 2017-02-11 ENCOUNTER — Ambulatory Visit: Payer: BLUE CROSS/BLUE SHIELD | Admitting: Family Medicine

## 2017-02-24 ENCOUNTER — Other Ambulatory Visit: Payer: Self-pay

## 2017-02-24 DIAGNOSIS — Z87898 Personal history of other specified conditions: Secondary | ICD-10-CM

## 2017-02-25 ENCOUNTER — Other Ambulatory Visit: Payer: BLUE CROSS/BLUE SHIELD

## 2017-02-25 DIAGNOSIS — Z87898 Personal history of other specified conditions: Secondary | ICD-10-CM

## 2017-02-26 LAB — PSA: Prostate Specific Ag, Serum: 2.3 ng/mL (ref 0.0–4.0)

## 2017-03-02 ENCOUNTER — Telehealth: Payer: Self-pay

## 2017-03-02 DIAGNOSIS — R972 Elevated prostate specific antigen [PSA]: Secondary | ICD-10-CM

## 2017-03-02 NOTE — Telephone Encounter (Signed)
-----   Message from Nori Riis, PA-C sent at 03/01/2017  9:24 PM EST ----- Please confirm with Devon Erickson that he is taking the finasteride daily.  If he is taking the finasteride, we need to repeat the PSA again in 3 months as it is continuing to rise.

## 2017-03-02 NOTE — Telephone Encounter (Signed)
Patient notified apt for lab made and orders placed

## 2017-03-17 IMAGING — CR DG CHEST 2V
1 series · 2 of 2 positions shown · non-contrast
Comparison: None.

CLINICAL DATA: Midsternal chest pain since yesterday.

EXAM:
CHEST  2 VIEW

[Series 1: dg chest 2 view · 0.14mm/px · 2 of 2 slices shown]
[im 1/2]
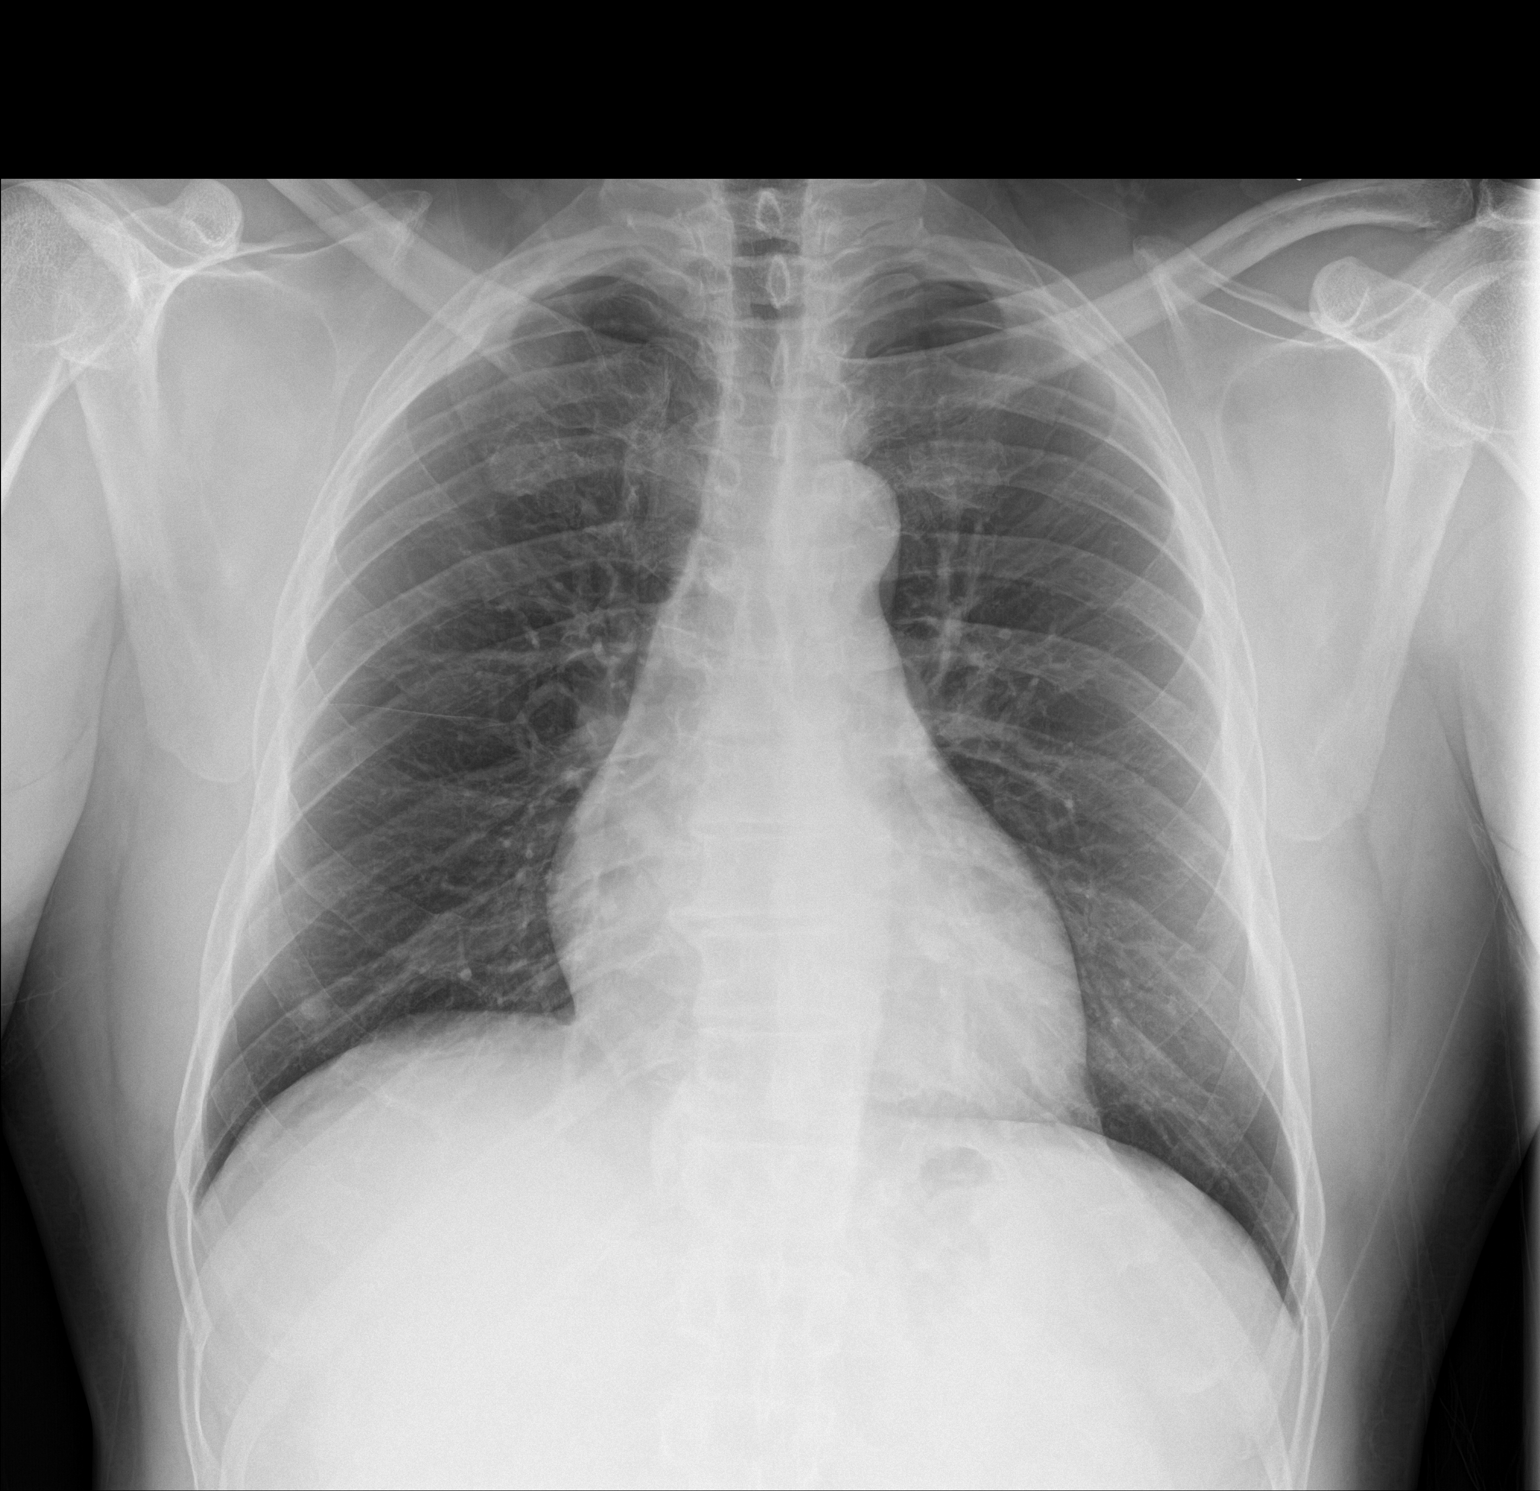
[im 2/2]
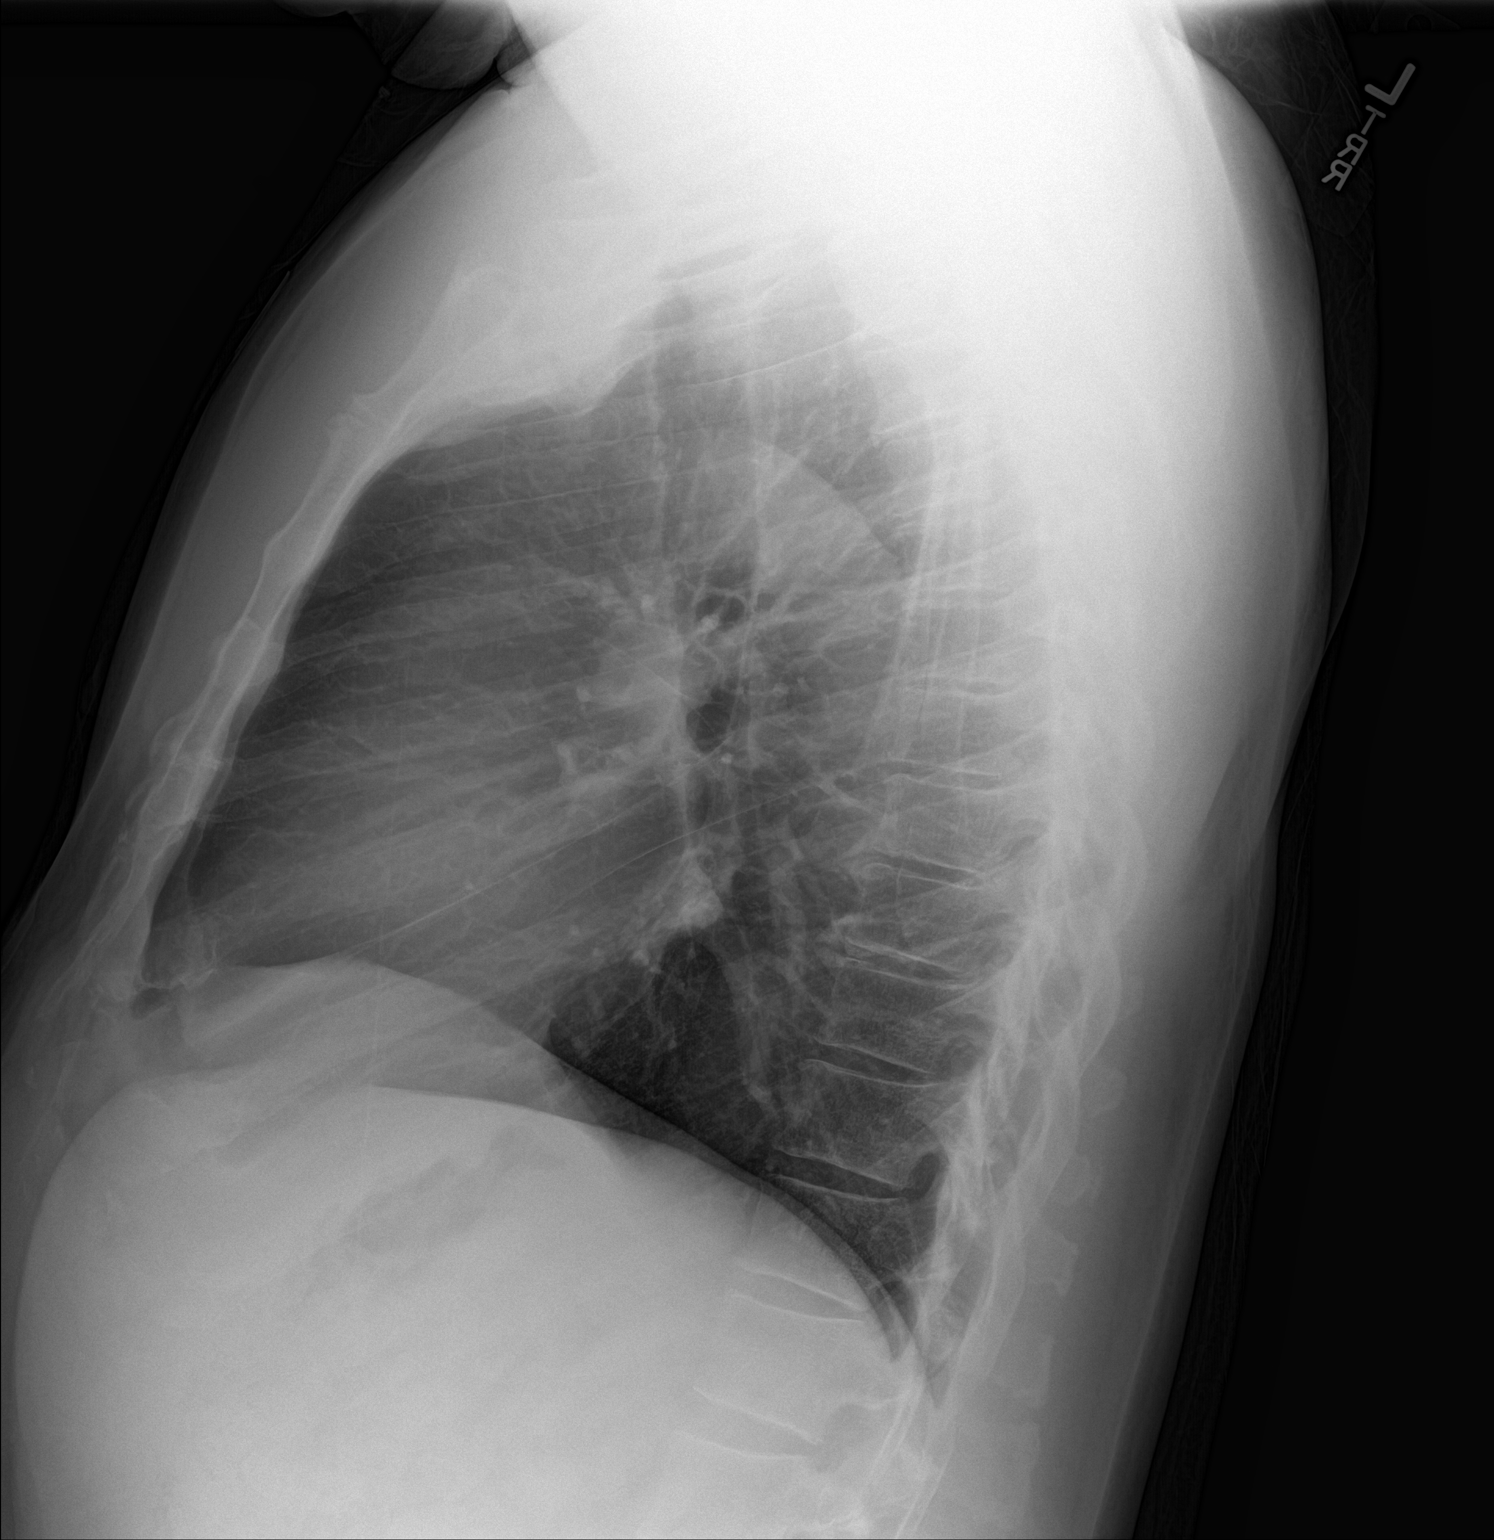

[2 of 2 positions shown; findings below may reference images not displayed]

FINDINGS: Normal heart size and pulmonary vascularity. No focal airspace
disease or consolidation in the lungs. No blunting of costophrenic
angles. No pneumothorax. Mediastinal contours appear intact.
Calcified granuloma in the right lung base. Mild degenerative
changes in the spine.
IMPRESSION: No active cardiopulmonary disease.

## 2017-05-18 ENCOUNTER — Other Ambulatory Visit: Payer: BLUE CROSS/BLUE SHIELD

## 2017-05-18 DIAGNOSIS — R972 Elevated prostate specific antigen [PSA]: Secondary | ICD-10-CM

## 2017-05-19 LAB — PSA: PROSTATE SPECIFIC AG, SERUM: 1.2 ng/mL (ref 0.0–4.0)

## 2017-05-23 ENCOUNTER — Encounter: Payer: Self-pay | Admitting: Urology

## 2017-05-23 ENCOUNTER — Other Ambulatory Visit: Payer: Self-pay

## 2017-05-23 ENCOUNTER — Ambulatory Visit (INDEPENDENT_AMBULATORY_CARE_PROVIDER_SITE_OTHER): Payer: BLUE CROSS/BLUE SHIELD | Admitting: Urology

## 2017-05-23 VITALS — BP 154/98 | HR 62 | Ht 68.0 in | Wt 203.2 lb

## 2017-05-23 DIAGNOSIS — N138 Other obstructive and reflux uropathy: Secondary | ICD-10-CM

## 2017-05-23 DIAGNOSIS — N503 Cyst of epididymis: Secondary | ICD-10-CM

## 2017-05-23 DIAGNOSIS — Z87898 Personal history of other specified conditions: Secondary | ICD-10-CM | POA: Diagnosis not present

## 2017-05-23 DIAGNOSIS — N401 Enlarged prostate with lower urinary tract symptoms: Secondary | ICD-10-CM | POA: Diagnosis not present

## 2017-05-23 DIAGNOSIS — N529 Male erectile dysfunction, unspecified: Secondary | ICD-10-CM | POA: Diagnosis not present

## 2017-05-23 MED ORDER — FINASTERIDE 5 MG PO TABS: 5.0000 mg | ORAL_TABLET | Freq: Every day | ORAL | 3 refills | Status: DC

## 2017-05-23 NOTE — Progress Notes (Signed)
05/23/2017 9:04 AM   Devon Erickson January 03, 1961 [id number]  Referring provider: Roselee Nova, MD [address] [place], [place] [address]  Chief Complaint  Patient presents with  . Benign Prostatic Hypertrophy    HPI: Patient is a 57 year old African-American male with a history of elevated PSA, erectile dysfunction and BPH with LUTS who presents today for 6 month follow-up.  History of elevated PSA Patient was found a PSA of 10.3 ng/mL on 03/11/2012. He underwent a biopsy at that time which was benign. His recent PSA is 1.2 ng/mL on 05/18/2017.     Erectile dysfunction His SHIM score is 11, which is moderate ED.   He has been having difficulty with erections for several months.   His previous SHIM was 7.  His major complaint is premature ejaculation.  His libido is preserved.   His risk factors for ED are age, BPH, DM, HTN and HLD.  He denies any painful erections or curvatures with his erections.   He is still having spontaneous erections.  He has tried Viagra in the past, but he and his wife do not want to use the medication.   He is interested in trying the Cialis 20 mg on demand dosing hoping for a more spontaneous effect.  He has not tried the Cialis yet.   SHIM    Row Name 05/23/17 0846         SHIM: Over the last 6 months:   How do you rate your confidence that you could get and keep an erection?  Very Low     When you had erections with sexual stimulation, how often were your erections hard enough for penetration (entering your partner)?  A Few Times (much less than half the time)     During sexual intercourse, how often were you able to maintain your erection after you had penetrated (entered) your partner?  A Few Times (much less than half the time)     During sexual intercourse, how difficult was it to maintain your erection to completion of intercourse?  Extremely Difficult     When you attempted sexual intercourse, how often was it satisfactory for  you?  Almost Always or Always       SHIM Total Score   SHIM  11        Score: 1-7 Severe ED 8-11 Moderate ED 12-16 Mild-Moderate ED 17-21 Mild ED 22-25 No ED   Knot on testicle  Patient states that last week he strained his groin he then became concerned and then started to examine his testicles.  He stated he noted a abnormality in his left testicle and would like it examined today.  He states he has not noticed any change in the area since its discovery.  He has not had any pain associated with this abnormality.  BPH WITH LUTS His IPSS score today is 1, which is mild lower urinary tract symptomatology.  He is pleased with his quality life due to his urinary symptoms.  His previous IPSS score was 0/2.  He has no major complaints at today's visit.  He denies any dysuria, hematuria or suprapubic pain.  He also denies any recent fevers, chills, nausea or vomiting.   He does not have a family history of PCa. IPSS    Row Name 05/23/17 0800         International Prostate Symptom Score   How often have you had the sensation of not emptying your bladder?  Not  at All     How often have you had to urinate less than every two hours?  Not at All     How often have you found you stopped and started again several times when you urinated?  Not at All     How often have you found it difficult to postpone urination?  Not at All     How often have you had a weak urinary stream?  Not at All     How often have you had to strain to start urination?  Not at All     How many times did you typically get up at night to urinate?  1 Time     Total IPSS Score  1       Quality of Life due to urinary symptoms   If you were to spend the rest of your life with your urinary condition just the way it is now how would you feel about that?  Pleased        Score:  1-7 Mild 8-19 Moderate 20-35 Severe     PMH: Past Medical History:  Diagnosis Date  . BPH (benign prostatic hyperplasia)   . Elevated PSA    . Erectile dysfunction   . Hyperlipidemia   . Hypertension   . Hypogonadism in male   . Metabolic syndrome   . Nocturia     Surgical History: Past Surgical History:  Procedure Laterality Date  . FOOT SURGERY Left   . Left leg surgery    . VASECTOMY      Home Medications:  Allergies as of 05/23/2017   No Known Allergies     Medication List        Accurate as of 05/23/17  9:04 AM. Always use your most recent med list.          amLODipine-olmesartan 10-40 MG tablet Commonly known as:  AZOR Take 1 tablet by mouth daily.   atorvastatin 20 MG tablet Commonly known as:  LIPITOR   finasteride 5 MG tablet Commonly known as:  PROSCAR Take 1 tablet (5 mg total) by mouth daily.   hydrochlorothiazide 25 MG tablet Commonly known as:  HYDRODIURIL Take 1 tablet (25 mg total) by mouth daily.   rosuvastatin 5 MG tablet Commonly known as:  CRESTOR Take 1 tablet (5 mg total) by mouth at bedtime.   tadalafil 20 MG tablet Commonly known as:  CIALIS Take 1 tablet (20 mg total) by mouth daily as needed for erectile dysfunction.       Allergies: No Known Allergies  Family History: Family History  Problem Relation Age of Onset  . Prostate cancer Neg Hx   . Bladder Cancer Neg Hx   . Kidney disease Neg Hx   . Kidney cancer Neg Hx     Social History:  reports that he has quit smoking. He has quit using smokeless tobacco. He reports that he does not drink alcohol or use drugs.  ROS: UROLOGY Frequent Urination?: No Hard to postpone urination?: No Burning/pain with urination?: No Get up at night to urinate?: No Leakage of urine?: No Urine stream starts and stops?: No Trouble starting stream?: No Do you have to strain to urinate?: No Blood in urine?: No Urinary tract infection?: No Sexually transmitted disease?: No Injury to kidneys or bladder?: No Painful intercourse?: No Weak stream?: No Erection problems?: No Penile pain?: No  Gastrointestinal Nausea?:  No Vomiting?: No Indigestion/heartburn?: No Diarrhea?: No Constipation?: No  Constitutional Fever: No Night sweats?:  No Weight loss?: No Fatigue?: No  Skin Skin rash/lesions?: No Itching?: No  Eyes Blurred vision?: No Double vision?: No  Ears/Nose/Throat Sore throat?: No Sinus problems?: No  Hematologic/Lymphatic Swollen glands?: No Easy bruising?: No  Cardiovascular Leg swelling?: No Chest pain?: No  Respiratory Cough?: No Shortness of breath?: No  Endocrine Excessive thirst?: No  Musculoskeletal Back pain?: No Joint pain?: No  Neurological Headaches?: No Dizziness?: No  Psychologic Depression?: No Anxiety?: No  Physical Exam: BP (!) 154/98 (BP Location: Right Arm)   Pulse 62   Ht 5\' 8"  (1.727 m)   Wt 203 lb 3.2 oz (92.2 kg)   BMI 30.90 kg/m   Constitutional: Well nourished. Alert and oriented, No acute distress. HEENT: [place] AT, moist mucus membranes. Trachea midline, no masses. Cardiovascular: No clubbing, cyanosis, or edema. Respiratory: Normal respiratory effort, no increased work of breathing. GI: Abdomen is soft, non tender, non distended, no abdominal masses. Liver and spleen not palpable.  No hernias appreciated.  Stool sample for occult testing is not indicated.   GU: No CVA tenderness.  No bladder fullness or masses.  Patient with circumcised phallus.  Urethral meatus is patent.  No penile discharge. No penile lesions or rashes. Scrotum without lesions, cysts, rashes and/or edema.  Testicles are located scrotally bilaterally. No masses are appreciated in the testicles. Left and right epididymis are normal.  Left epididymal cysts noted.  Two 5 mm x 5 mm in size.   Rectal: Patient with  normal sphincter tone. Anus and perineum without scarring or rashes. No rectal masses are appreciated. Prostate is approximately 45 grams, no nodules are appreciated. Seminal vesicles are normal. Skin: No rashes, bruises or suspicious lesions. Lymph: No  cervical or inguinal adenopathy. Neurologic: Grossly intact, no focal deficits, moving all 4 extremities. Psychiatric: Normal mood and affect.    Laboratory Data: PSA History:  10.3 ng/mL on 01/13/2012  bx-neg on 03/09/2012  3.1 ng/mL on 06/15/2012  1.6 ng/mL on 12/15/2012  2.0 ng/mL on 06/13/2013  1.7 ng/mL on 12/24/2013  1.9 ng/mL on 06/13/2014  1.6 ng/mL on 11/24/2015              1.6 ng/mL on 05/26/2016  2.1 ng/mL on 11/26/2016  2.0 in 11/2016  2.3 in 01/2017  1.2 in 04/2017  Lab Results  Component Value Date   PSA 2.0 06/13/2013     Lab Results  Component Value Date   HGBA1C 6.6 11/11/2016   I have reviewed the labs  Assessment & Plan:    1. History of elevated PSA:   Patient was found a PSA of 10.3 ng/mL on 03/11/2012.   He underwent a biopsy at that time which was benign.   His most recent PSA was 1.2 ng/mL.  RTC in 6 months for PSA.      2. Erectile dysfunction  - SHIM score is 11, it is improving  - has not taken PDE5i at this time  - RTC in 6 months for SHIM and exam  3.  Left epididymal cysts Discussed with patient that his area of concern is most likely benign cysts, but we could get a scrotal ultrasound for confirmation He would like to continue to perform self exams and report any changes in the area and return in 3 months for me to reexamine the area  4. BPH with LUTS  - IPSS score is 1/1, it is stable  - Continue conservative management, avoiding bladder irritants and timed voiding's  - Continue finasteride 5 mg  daily-refills given  - RTC in 6 months for IPSS, PSA and exam    Return in about 3 months (around 08/23/2017) for recheck .  Zara Council, [facility] [address], [place], [place] [address] [phone]

## 2017-05-26 ENCOUNTER — Other Ambulatory Visit: Payer: BLUE CROSS/BLUE SHIELD

## 2017-05-31 ENCOUNTER — Ambulatory Visit: Payer: BLUE CROSS/BLUE SHIELD | Admitting: Urology

## 2017-05-31 ENCOUNTER — Other Ambulatory Visit: Payer: Self-pay

## 2017-07-21 ENCOUNTER — Other Ambulatory Visit: Payer: Self-pay

## 2017-07-21 DIAGNOSIS — Z1211 Encounter for screening for malignant neoplasm of colon: Secondary | ICD-10-CM

## 2017-07-21 NOTE — Progress Notes (Signed)
am

## 2017-07-28 ENCOUNTER — Telehealth: Payer: Self-pay

## 2017-07-28 ENCOUNTER — Other Ambulatory Visit: Payer: Self-pay

## 2017-07-28 NOTE — Telephone Encounter (Signed)
Patient contacted office to reschedule his colonoscopy to sooner date.  He has been moved to 08/22/17.  Contacted Jessie in Endo to make her aware of the date change.  New Instructions have been sent to patient.

## 2017-07-29 ENCOUNTER — Other Ambulatory Visit: Payer: Self-pay

## 2017-08-19 ENCOUNTER — Encounter: Payer: Self-pay | Admitting: Student

## 2017-08-22 ENCOUNTER — Ambulatory Visit: Payer: BLUE CROSS/BLUE SHIELD | Admitting: Urology

## 2017-08-22 ENCOUNTER — Ambulatory Visit: Payer: BLUE CROSS/BLUE SHIELD | Admitting: Anesthesiology

## 2017-08-22 ENCOUNTER — Encounter: Payer: Self-pay | Admitting: Anesthesiology

## 2017-08-22 ENCOUNTER — Encounter
Admission: RE | Disposition: A | Discharge status: Home / Self Care | Payer: Self-pay | Source: Ambulatory Visit | Attending: Gastroenterology

## 2017-08-22 ENCOUNTER — Ambulatory Visit
Admission: RE | Admit: 2017-08-22 | Discharge: 2017-08-22 | Disposition: A | Discharge status: Home / Self Care | Payer: BLUE CROSS/BLUE SHIELD | Source: Ambulatory Visit | Attending: Gastroenterology | Admitting: Gastroenterology

## 2017-08-22 DIAGNOSIS — Z87891 Personal history of nicotine dependence: Secondary | ICD-10-CM | POA: Insufficient documentation

## 2017-08-22 DIAGNOSIS — N400 Enlarged prostate without lower urinary tract symptoms: Secondary | ICD-10-CM | POA: Diagnosis not present

## 2017-08-22 DIAGNOSIS — Z79899 Other long term (current) drug therapy: Secondary | ICD-10-CM | POA: Diagnosis not present

## 2017-08-22 DIAGNOSIS — D122 Benign neoplasm of ascending colon: Secondary | ICD-10-CM | POA: Diagnosis not present

## 2017-08-22 DIAGNOSIS — K5730 Diverticulosis of large intestine without perforation or abscess without bleeding: Secondary | ICD-10-CM | POA: Insufficient documentation

## 2017-08-22 DIAGNOSIS — I10 Essential (primary) hypertension: Secondary | ICD-10-CM | POA: Insufficient documentation

## 2017-08-22 DIAGNOSIS — R351 Nocturia: Secondary | ICD-10-CM | POA: Insufficient documentation

## 2017-08-22 DIAGNOSIS — E8881 Metabolic syndrome: Secondary | ICD-10-CM | POA: Insufficient documentation

## 2017-08-22 DIAGNOSIS — D123 Benign neoplasm of transverse colon: Secondary | ICD-10-CM | POA: Insufficient documentation

## 2017-08-22 DIAGNOSIS — Z1211 Encounter for screening for malignant neoplasm of colon: Secondary | ICD-10-CM | POA: Diagnosis not present

## 2017-08-22 DIAGNOSIS — E785 Hyperlipidemia, unspecified: Secondary | ICD-10-CM | POA: Insufficient documentation

## 2017-08-22 HISTORY — PX: COLONOSCOPY WITH PROPOFOL: SHX5780

## 2017-08-22 SURGERY — COLONOSCOPY WITH PROPOFOL
Anesthesia: General

## 2017-08-22 MED ORDER — MIDAZOLAM HCL 2 MG/2ML IJ SOLN
INTRAMUSCULAR | Status: DC | PRN
  Administered 2017-08-22: 2 mg via INTRAVENOUS

## 2017-08-22 MED ORDER — PROPOFOL 500 MG/50ML IV EMUL
INTRAVENOUS | Status: DC | PRN
  Administered 2017-08-22: 120 ug/kg/min via INTRAVENOUS

## 2017-08-22 MED ORDER — MIDAZOLAM HCL 2 MG/2ML IJ SOLN
INTRAMUSCULAR | Status: AC
  Filled 2017-08-22: qty 2

## 2017-08-22 MED ORDER — FENTANYL CITRATE (PF) 100 MCG/2ML IJ SOLN
INTRAMUSCULAR | Status: AC
  Filled 2017-08-22: qty 2

## 2017-08-22 MED ORDER — PROPOFOL 500 MG/50ML IV EMUL
INTRAVENOUS | Status: AC
  Filled 2017-08-22: qty 50

## 2017-08-22 MED ORDER — FENTANYL CITRATE (PF) 100 MCG/2ML IJ SOLN
INTRAMUSCULAR | Status: DC | PRN
  Administered 2017-08-22: 50 ug via INTRAVENOUS

## 2017-08-22 MED ORDER — SODIUM CHLORIDE 0.9 % IV SOLN
INTRAVENOUS | Status: DC
  Administered 2017-08-22: 1000 mL via INTRAVENOUS

## 2017-08-22 NOTE — Anesthesia Post-op Follow-up Note (Signed)
Anesthesia QCDR form completed.        

## 2017-08-22 NOTE — Anesthesia Postprocedure Evaluation (Signed)
Anesthesia Post Note  Patient: Devon Erickson  Procedure(s) Performed: COLONOSCOPY WITH PROPOFOL (N/A )  Patient location during evaluation: Endoscopy Anesthesia Type: General Level of consciousness: awake and alert Pain management: pain level controlled Vital Signs Assessment: post-procedure vital signs reviewed and stable Respiratory status: spontaneous breathing, nonlabored ventilation, respiratory function stable and patient connected to nasal cannula oxygen Cardiovascular status: blood pressure returned to baseline and stable Postop Assessment: no apparent nausea or vomiting Anesthetic complications: no     Last Vitals:  Vitals:   08/22/17 0923 08/22/17 0933  BP: 97/72 116/79  Pulse: (!) 44 (!) 54  Resp: 17 13  Temp:    SpO2: 94% 95%    Last Pain:  Vitals:   08/22/17 0933  TempSrc:   PainSc: 0-No pain                 Sharnell Knight S

## 2017-08-22 NOTE — Op Note (Signed)
[facility] Patient Name: Devon Erickson Procedure Date: 08/22/2017 8:35 AM MRN: [id number] Account #: [id number] Date of Birth: [birth year] Admit Type: Outpatient Age: 57 Room: [facility] ENDO ROOM 4 Gender: Male Note Status: Finalized Procedure:            Colonoscopy Indications:          Screening for colorectal malignant neoplasm Providers:            Jonathon Bellows MD, MD Medicines:            Monitored Anesthesia Care Complications:        No immediate complications. Procedure:            Pre-Anesthesia Assessment:                       - Prior to the procedure, a History and Physical was                        performed, and patient medications, allergies and                        sensitivities were reviewed. The patient's tolerance of                        previous anesthesia was reviewed.                       - The risks and benefits of the procedure and the                        sedation options and risks were discussed with the                        patient. All questions were answered and informed                        consent was obtained.                       - ASA Grade Assessment: II - A patient with mild                        systemic disease.                       After obtaining informed consent, the colonoscope was                        passed under direct vision. Throughout the procedure,                        the patient's blood pressure, pulse, and oxygen                        saturations were monitored continuously. The                        Colonoscope was introduced through the anus and                        advanced to the the cecum, identified by the  appendiceal orifice, IC valve and transillumination.                        The colonoscopy was performed with ease. The patient                        tolerated the procedure well. The quality of the bowel                        preparation was  good. Findings:      The perianal and digital rectal examinations were normal.      Two sessile polyps were found in the ascending colon. The polyps were 3       to 4 mm in size. These polyps were removed with a jumbo cold forceps.       Resection and retrieval were complete.      A 5 mm polyp was found in the transverse colon. The polyp was sessile.       The polyp was removed with a jumbo cold forceps. Resection and retrieval       were complete.      Multiple small-mouthed diverticula were found in the entire colon.      The exam was otherwise without abnormality on direct and retroflexion       views. Impression:           - Two 3 to 4 mm polyps in the ascending colon, removed                        with a jumbo cold forceps. Resected and retrieved.                       - One 5 mm polyp in the transverse colon, removed with                        a jumbo cold forceps. Resected and retrieved.                       - Diverticulosis in the entire examined colon.                       - The examination was otherwise normal on direct and                        retroflexion views. Recommendation:       - Discharge patient to home.                       - Resume previous diet.                       - Continue present medications.                       - Await pathology results.                       - Repeat colonoscopy in 3 - 5 years for surveillance. Procedure Code(s):    --- Professional ---                       [phone], Colonoscopy, flexible; with biopsy, single  or                        multiple Diagnosis Code(s):    --- Professional ---                       Z12.11, Encounter for screening for malignant neoplasm                        of colon                       D12.2, Benign neoplasm of ascending colon                       D12.3, Benign neoplasm of transverse colon (hepatic                        flexure or splenic flexure)                       K57.30, Diverticulosis of  large intestine without                        perforation or abscess without bleeding CPT copyright 2017 American Medical Association. All rights reserved. The codes documented in this report are preliminary and upon coder review may  be revised to meet current compliance requirements. Jonathon Bellows, MD Jonathon Bellows MD, MD 08/22/2017 9:01:36 AM This report has been signed electronically. Number of Addenda: 0 Note Initiated On: 08/22/2017 8:35 AM Scope Withdrawal Time: 0 hours 12 minutes 1 second  Total Procedure Duration: 0 hours 13 minutes 55 seconds       [facility]

## 2017-08-22 NOTE — H&P (Signed)
Jonathon Bellows, MD [address] [place], [place], [place], [address] [address], [place], [place], [place], [address] Phone: [phone]  Fax: [phone]  Primary Care Physician:  [facility] [place]   Pre-Procedure History & Physical: HPI:  Devon Erickson is a 57 y.o. male is here for an colonoscopy.   Past Medical History:  Diagnosis Date  . BPH (benign prostatic hyperplasia)   . Elevated PSA   . Erectile dysfunction   . Hyperlipidemia   . Hypertension   . Hypogonadism in male   . Metabolic syndrome   . Nocturia     Past Surgical History:  Procedure Laterality Date  . FOOT SURGERY Left   . Left leg surgery    . VASECTOMY      Prior to Admission medications   Medication Sig Start Date End Date Taking? Authorizing Provider  amLODipine-olmesartan (AZOR) 10-40 MG tablet Take 1 tablet by mouth daily. 11/11/16  Yes Roselee Nova, MD  atorvastatin (LIPITOR) 20 MG tablet  05/06/17  Yes [provider]  finasteride (PROSCAR) 5 MG tablet Take 1 tablet (5 mg total) by mouth daily. 05/23/17  Yes McGowan, Larene Beach A, PA-C  hydrochlorothiazide (HYDRODIURIL) 25 MG tablet Take 1 tablet (25 mg total) by mouth daily. 06/09/16  Yes Roselee Nova, MD  rosuvastatin (CRESTOR) 5 MG tablet Take 1 tablet (5 mg total) by mouth at bedtime. Patient not taking: Reported on 08/22/2017 11/11/16   Roselee Nova, MD  tadalafil (CIALIS) 20 MG tablet Take 1 tablet (20 mg total) by mouth daily as needed for erectile dysfunction. Patient not taking: Reported on 05/23/2017 11/29/16   Zara Council A, PA-C    Allergies as of 07/21/2017  . (No Known Allergies)    Family History  Problem Relation Age of Onset  . Prostate cancer Neg Hx   . Bladder Cancer Neg Hx   . Kidney disease Neg Hx   . Kidney cancer Neg Hx     Social History   Socioeconomic History  . Marital status: Married    Spouse name: Not on file  . Number of children: Not on file  .  Years of education: Not on file  . Highest education level: Not on file  Occupational History  . Not on file  Social Needs  . Financial resource strain: Not on file  . Food insecurity:    Worry: Not on file    Inability: Not on file  . Transportation needs:    Medical: Not on file    Non-medical: Not on file  Tobacco Use  . Smoking status: Former Research scientist (life sciences)  . Smokeless tobacco: Former Systems developer    Types: Chew  . Tobacco comment: quit 1996  Substance and Sexual Activity  . Alcohol use: No    Alcohol/week: 0.0 oz  . Drug use: No  . Sexual activity: Yes  Lifestyle  . Physical activity:    Days per week: Not on file    Minutes per session: Not on file  . Stress: Not on file  Relationships  . Social connections:    Talks on phone: Not on file    Gets together: Not on file    Attends religious service: Not on file    Active member of club or organization: Not on file    Attends meetings of clubs or organizations: Not on file    Relationship status: Not on file  . Intimate partner violence:  Fear of current or ex partner: Not on file    Emotionally abused: Not on file    Physically abused: Not on file    Forced sexual activity: Not on file  Other Topics Concern  . Not on file  Social History Narrative   Caffeine 1 serving daily.    Review of Systems: See HPI, otherwise negative ROS  Physical Exam: BP (!) 153/86   Pulse (!) 50   Temp (!) 96.4 F (35.8 C) (Tympanic)   Resp 18   Ht 5\' 8"  (1.727 m)   Wt 197 lb (89.4 kg)   SpO2 99%   BMI 29.95 kg/m  General:   Alert,  pleasant and cooperative in NAD Head:  Normocephalic and atraumatic. Neck:  Supple; no masses or thyromegaly. Lungs:  Clear throughout to auscultation, normal respiratory effort.    Heart:  +S1, +S2, Regular rate and rhythm, No edema. Abdomen:  Soft, nontender and nondistended. Normal bowel sounds, without guarding, and without rebound.   Neurologic:  Alert and  oriented x4;  grossly normal  neurologically.  Impression/Plan: Devon Erickson is here for an colonoscopy to be performed for Screening colonoscopy average risk   Risks, benefits, limitations, and alternatives regarding  colonoscopy have been reviewed with the patient.  Questions have been answered.  All parties agreeable.   Jonathon Bellows, MD  08/22/2017, 8:36 AM

## 2017-08-22 NOTE — Transfer of Care (Signed)
Immediate Anesthesia Transfer of Care Note  Patient: Devon Erickson  Procedure(s) Performed: COLONOSCOPY WITH PROPOFOL (N/A )  Patient Location: PACU  Anesthesia Type:General  Level of Consciousness: awake and sedated  Airway & Oxygen Therapy: Patient Spontanous Breathing and Patient connected to face mask oxygen  Post-op Assessment: Report given to RN and Post -op Vital signs reviewed and stable  Post vital signs: Reviewed and stable  Last Vitals:  Vitals Value Taken Time  BP    Temp    Pulse    Resp    SpO2      Last Pain:  Vitals:   08/22/17 0746  TempSrc: Tympanic  PainSc: 5       Patients Stated Pain Goal: 0 (16/07/37 1062)  Complications: No apparent anesthesia complications

## 2017-08-22 NOTE — Anesthesia Preprocedure Evaluation (Signed)
Anesthesia Evaluation  Patient identified by MRN, date of birth, ID band Patient awake    Reviewed: Allergy & Precautions, NPO status , Patient's Chart, lab work & pertinent test results, reviewed documented beta blocker date and time   Airway Mallampati: III  TM Distance: >3 FB     Dental  (+) Chipped   Pulmonary former smoker,           Cardiovascular hypertension, Pt. on medications      Neuro/Psych    GI/Hepatic   Endo/Other    Renal/GU      Musculoskeletal   Abdominal   Peds  Hematology   Anesthesia Other Findings   Reproductive/Obstetrics                             Anesthesia Physical Anesthesia Plan  ASA: III  Anesthesia Plan: General   Post-op Pain Management:    Induction: Intravenous  PONV Risk Score and Plan:   Airway Management Planned:   Additional Equipment:   Intra-op Plan:   Post-operative Plan:   Informed Consent: I have reviewed the patients History and Physical, chart, labs and discussed the procedure including the risks, benefits and alternatives for the proposed anesthesia with the patient or authorized representative who has indicated his/her understanding and acceptance.     Plan Discussed with: CRNA  Anesthesia Plan Comments:         Anesthesia Quick Evaluation

## 2017-08-22 NOTE — Anesthesia Procedure Notes (Signed)
Performed by: Cook-Martin, Edgard Debord Pre-anesthesia Checklist: Patient identified, Emergency Drugs available, Suction available, Patient being monitored and Timeout performed Patient Re-evaluated:Patient Re-evaluated prior to induction Oxygen Delivery Method: Nasal cannula Preoxygenation: Pre-oxygenation with 100% oxygen Induction Type: IV induction Placement Confirmation: positive ETCO2 and CO2 detector       

## 2017-08-23 LAB — SURGICAL PATHOLOGY

## 2017-08-24 ENCOUNTER — Encounter: Payer: Self-pay | Admitting: Gastroenterology

## 2017-08-26 ENCOUNTER — Encounter: Payer: Self-pay | Admitting: Gastroenterology

## 2017-08-30 ENCOUNTER — Telehealth: Payer: Self-pay | Admitting: Gastroenterology

## 2017-08-30 NOTE — Telephone Encounter (Signed)
Patient states someone called him last week. Patient had procedure on 6.24.19. Possible results call. Please advise.

## 2017-08-30 NOTE — Telephone Encounter (Signed)
LVM to return patients call regarding colonoscopy results.    Thanks [employer]

## 2017-09-06 ENCOUNTER — Telehealth: Payer: Self-pay | Admitting: Gastroenterology

## 2017-09-06 ENCOUNTER — Encounter: Payer: Self-pay | Admitting: Physician Assistant

## 2017-09-06 NOTE — Telephone Encounter (Signed)
LVM for pt to return my call.

## 2017-09-06 NOTE — Telephone Encounter (Signed)
Patient is returning a call for results

## 2017-09-07 NOTE — Telephone Encounter (Signed)
Returned patients call informed him that his colonoscopy showed precancerous polyps and he will need to have a repeat colonoscopy in 3 years.  Thanks [employer]

## 2017-10-06 ENCOUNTER — Ambulatory Visit: Payer: BLUE CROSS/BLUE SHIELD | Admitting: Gastroenterology

## 2017-11-01 ENCOUNTER — Ambulatory Visit (INDEPENDENT_AMBULATORY_CARE_PROVIDER_SITE_OTHER): Payer: BLUE CROSS/BLUE SHIELD | Admitting: Urology

## 2017-11-01 ENCOUNTER — Encounter: Payer: Self-pay | Admitting: Urology

## 2017-11-01 ENCOUNTER — Other Ambulatory Visit: Payer: Self-pay

## 2017-11-01 VITALS — BP 123/72 | HR 63 | Ht 67.0 in | Wt 201.4 lb

## 2017-11-01 DIAGNOSIS — N503 Cyst of epididymis: Secondary | ICD-10-CM | POA: Diagnosis not present

## 2017-11-01 DIAGNOSIS — Z87898 Personal history of other specified conditions: Secondary | ICD-10-CM

## 2017-11-01 DIAGNOSIS — N529 Male erectile dysfunction, unspecified: Secondary | ICD-10-CM

## 2017-11-01 DIAGNOSIS — N401 Enlarged prostate with lower urinary tract symptoms: Secondary | ICD-10-CM | POA: Diagnosis not present

## 2017-11-01 DIAGNOSIS — N138 Other obstructive and reflux uropathy: Secondary | ICD-10-CM

## 2017-11-01 MED ORDER — FINASTERIDE 5 MG PO TABS: 5.0000 mg | ORAL_TABLET | Freq: Every day | ORAL | 3 refills | Status: DC

## 2017-11-01 NOTE — Progress Notes (Signed)
11/01/2017 8:51 AM   [name] 1960/06/12 [id number]  Referring provider: [facility], [address] [place], [place] [address]  Chief Complaint  Patient presents with  . History of elevated PSA    HPI: Patient is a 57 year old African-American male with a history of elevated PSA, erectile dysfunction and BPH with LUTS who presents today for 6 month follow-up.  History of elevated PSA  10.3 ng/mL on 01/13/2012  bx-neg on 03/09/2012  - started finasteride   3.1  ng/mL on 06/15/2012  1.6 ng/mL on 12/15/2012  2.0 ng/mL on 06/13/2013  1.7 ng/mL on 12/24/2013  1.9 (3.8) ng/mL on 06/13/2014  1.6 (3.2) ng/mL on 11/24/2015             1.6 (3.2) ng/mL on 05/26/2016  2.1 (4.2) ng/mL on 11/26/2016  2.0 (4.0) in 11/2016  2.3 (4.6) in 01/2017  1.2 (2.4) in 04/2017  Erectile dysfunction His SHIM score is 8, which is moderate ED.   He has been having difficulty with erections for two years.  His previous SHIM was 11.  His major complaint is premature ejaculation.  His libido is preserved.   His risk factors for ED are age, BPH, DM, HTN and HLD.  He denies any painful erections or curvatures with his erections.   He is still having spontaneous erections.  He is not interested in any treatment at this time.  SHIM    Row Name 11/01/17 0836         SHIM: Over the last 6 months:   How do you rate your confidence that you could get and keep an erection?  Very Low     When you had erections with sexual stimulation, how often were your erections hard enough for penetration (entering your partner)?  A Few Times (much less than half the time)     During sexual intercourse, how often were you able to maintain your erection after you had penetrated (entered) your partner?  A Few Times (much less than half the time)     During sexual intercourse, how difficult was it to maintain your erection to completion of intercourse?  Difficult       SHIM Total  Score   SHIM  8        Score: 1-7 Severe ED 8-11 Moderate ED 12-16 Mild-Moderate ED 17-21 Mild ED 22-25 No ED   Knot on testicle  Patient states he is no longer concerned with the knot in his left testicle as it is not caused him any discomfort.  He states he also has been performing exams on occasions and has not been able to palpated at times.  BPH WITH LUTS  (prostate and/or bladder) IPSS score: 1/0    Previous score: 1/0    Major complaint(s):  None.  Denies any dysuria, hematuria or suprapubic pain.   Currently taking: Finasteride 5 mg daily  Denies any recent fevers, chills, nausea or vomiting.  He does not have a family history of PCa.  IPSS    Row Name 11/01/17 0800         International Prostate Symptom Score   How often have you had the sensation of not emptying your bladder?  Not at All     How often have you had to urinate less than every two hours?  Not at All     How often have you found you stopped and started again several times when you urinated?  Not at  All     How often have you found it difficult to postpone urination?  Not at All     How often have you had a weak urinary stream?  Not at All     How often have you had to strain to start urination?  Not at All     How many times did you typically get up at night to urinate?  1 Time     Total IPSS Score  1       Quality of Life due to urinary symptoms   If you were to spend the rest of your life with your urinary condition just the way it is now how would you feel about that?  Delighted        Score:  1-7 Mild 8-19 Moderate 20-35 Severe   PMH: Past Medical History:  Diagnosis Date  . BPH (benign prostatic hyperplasia)   . Elevated PSA   . Erectile dysfunction   . Hyperlipidemia   . Hypertension   . Hypogonadism in male   . Metabolic syndrome   . Nocturia     Surgical History: Past Surgical History:  Procedure Laterality Date  . COLONOSCOPY WITH PROPOFOL N/A 08/22/2017   Procedure:  COLONOSCOPY WITH PROPOFOL;  Surgeon: Jonathon Bellows, MD;  Location: [facility] ENDOSCOPY;  Service: Gastroenterology;  Laterality: N/A;  . FOOT SURGERY Left   . Left leg surgery    . VASECTOMY      Home Medications:  Allergies as of 11/01/2017   No Known Allergies     Medication List        Accurate as of 11/01/17  8:51 AM. Always use your most recent med list.          amLODipine-olmesartan 10-40 MG tablet Commonly known as:  AZOR Take 1 tablet by mouth daily.   atorvastatin 20 MG tablet Commonly known as:  LIPITOR   finasteride 5 MG tablet Commonly known as:  PROSCAR Take 1 tablet (5 mg total) by mouth daily.   hydrochlorothiazide 25 MG tablet Commonly known as:  HYDRODIURIL Take 1 tablet (25 mg total) by mouth daily.   rosuvastatin 5 MG tablet Commonly known as:  CRESTOR Take 1 tablet (5 mg total) by mouth at bedtime.   tadalafil 20 MG tablet Commonly known as:  ADCIRCA/CIALIS Take 1 tablet (20 mg total) by mouth daily as needed for erectile dysfunction.       Allergies: No Known Allergies  Family History: Family History  Problem Relation Age of Onset  . Prostate cancer Neg Hx   . Bladder Cancer Neg Hx   . Kidney disease Neg Hx   . Kidney cancer Neg Hx     Social History:  reports that he has quit smoking. He has quit using smokeless tobacco.  His smokeless tobacco use included chew. He reports that he does not drink alcohol or use drugs.  ROS: UROLOGY Frequent Urination?: No Hard to postpone urination?: No Burning/pain with urination?: No Get up at night to urinate?: No Leakage of urine?: No Urine stream starts and stops?: No Trouble starting stream?: No Do you have to strain to urinate?: No Blood in urine?: No Urinary tract infection?: No Sexually transmitted disease?: No Injury to kidneys or bladder?: No Painful intercourse?: No Weak stream?: No Erection problems?: No Penile pain?: No  Gastrointestinal Nausea?: No Vomiting?:  No Indigestion/heartburn?: No Diarrhea?: No Constipation?: No  Constitutional Fever: No Night sweats?: No Weight loss?: No Fatigue?: No  Skin Skin rash/lesions?: No  Itching?: No  Eyes Blurred vision?: No Double vision?: No  Ears/Nose/Throat Sore throat?: No Sinus problems?: No  Hematologic/Lymphatic Swollen glands?: No Easy bruising?: No  Cardiovascular Leg swelling?: No Chest pain?: No  Respiratory Cough?: No Shortness of breath?: No  Endocrine Excessive thirst?: No  Musculoskeletal Back pain?: No Joint pain?: No  Neurological Headaches?: No Dizziness?: No  Psychologic Depression?: No Anxiety?: No  Physical Exam: BP 123/72   Pulse 63   Ht 5\' 7"  (1.702 m)   Wt 201 lb 6.4 oz (91.4 kg)   BMI 31.54 kg/m   Constitutional: Well nourished. Alert and oriented, No acute distress. HEENT: [place] AT, moist mucus membranes. Trachea midline, no masses. Cardiovascular: No clubbing, cyanosis, or edema. Respiratory: Normal respiratory effort, no increased work of breathing. GI: Abdomen is soft, non tender, non distended, no abdominal masses. Liver and spleen not palpable.  No hernias appreciated.  Stool sample for occult testing is not indicated.   GU: No CVA tenderness.  No bladder fullness or masses.  Patient with circumcised phallus.  Urethral meatus is patent.  No penile discharge. No penile lesions or rashes. Scrotum without lesions, cysts, rashes and/or edema.  Testicles are located scrotally bilaterally. No masses are appreciated in the testicles. Left and right epididymis are normal.  Small epididymal cyst is noted on the left.  It is not painful. Rectal: Patient with  normal sphincter tone. Anus and perineum without scarring or rashes. No rectal masses are appreciated. Prostate is approximately 45 grams, no nodules are appreciated. Seminal vesicles are normal. Skin: No rashes, bruises or suspicious lesions. Lymph: No cervical or inguinal  adenopathy. Neurologic: Grossly intact, no focal deficits, moving all 4 extremities. Psychiatric: Normal mood and affect.    Laboratory Data: PSA History:  10.3 ng/mL on 01/13/2012  bx-neg on 03/09/2012  3.1  ng/mL on 06/15/2012  1.6 ng/mL on 12/15/2012  2.0 ng/mL on 06/13/2013  1.7 ng/mL on 12/24/2013  1.9 (3.8) ng/mL on 06/13/2014  1.6 (3.2) ng/mL on 11/24/2015             1.6 (3.2) ng/mL on 05/26/2016  2.1 (4.2) ng/mL on 11/26/2016  2.0 (4.0) in 11/2016  2.3 (4.6) in 01/2017  1.2 (2.4) in 04/2017  Lab Results  Component Value Date   PSA 2.0 06/13/2013     Lab Results  Component Value Date   HGBA1C 6.6 11/11/2016   I have reviewed the labs  Assessment & Plan:    1. History of elevated PSA Current PSA 1.2 (2.4) in 04/2017 PSA drawn today, is stable will return in 6 months for PSA and exam Continue finasteride 5 mg daily      2. Erectile dysfunction SHIM score is 8, it is worsening  has not taken PDE5i at this time  Not interested in therapy at this time  3.  Left epididymal cysts Unchanged  4. BPH with LUTS IPSS score is 1/0, it is improved  Continue conservative management, avoiding bladder irritants and timed voiding's Continue finasteride 5 mg daily-refills given RTC in 6 months for IPSS, PSA and exam    Return in about 6 months (around 05/02/2018) for IPSS, PSA and exam.  Zara Council, [facility]  [place], [place] [address] [phone]

## 2017-11-02 LAB — PSA: PROSTATE SPECIFIC AG, SERUM: 2 ng/mL (ref 0.0–4.0)

## 2017-11-03 ENCOUNTER — Telehealth: Payer: Self-pay | Admitting: Urology

## 2017-11-03 NOTE — Telephone Encounter (Signed)
App made and patient is aware  MB

## 2017-11-03 NOTE — Telephone Encounter (Signed)
-----   Message from Nori Riis, PA-C sent at 11/02/2017  4:50 PM EDT ----- I have sent a my chart message to Mr. Devon Erickson concerning his PSA results.  I would like his PSA repeated in 3 months.  Please call him to schedule that lab appointment.

## 2017-11-16 ENCOUNTER — Encounter

## 2017-11-16 ENCOUNTER — Ambulatory Visit: Payer: BLUE CROSS/BLUE SHIELD | Admitting: Gastroenterology

## 2018-02-01 ENCOUNTER — Other Ambulatory Visit: Payer: Self-pay

## 2018-02-01 DIAGNOSIS — Z87898 Personal history of other specified conditions: Secondary | ICD-10-CM

## 2018-02-02 ENCOUNTER — Other Ambulatory Visit: Payer: BLUE CROSS/BLUE SHIELD

## 2018-02-02 DIAGNOSIS — Z87898 Personal history of other specified conditions: Secondary | ICD-10-CM

## 2018-02-03 LAB — PSA: Prostate Specific Ag, Serum: 1.3 ng/mL (ref 0.0–4.0)

## 2018-05-02 ENCOUNTER — Other Ambulatory Visit: Payer: Self-pay

## 2018-05-02 DIAGNOSIS — Z87898 Personal history of other specified conditions: Secondary | ICD-10-CM

## 2018-05-03 ENCOUNTER — Other Ambulatory Visit: Payer: BLUE CROSS/BLUE SHIELD

## 2018-05-03 DIAGNOSIS — Z87898 Personal history of other specified conditions: Secondary | ICD-10-CM

## 2018-05-03 NOTE — Progress Notes (Signed)
05/05/2018 8:26 AM   Devon Erickson 1960-07-12 [id number]  Referring provider: Freddy Finner, NP [place], [place] [address]  Chief Complaint  Patient presents with  . Benign Prostatic Hypertrophy    HPI: Devon Erickson is a 58 y.o. male African-American male with a history of elevated PSA, erectile dysfunction and BPH with LUTS who presents today for 6 month follow-up.  History of elevated PSA  10.3 ng/mL on 01/13/2012  bx-neg on 03/09/2012  - started finasteride   3.1  ng/mL on 06/15/2012  1.6 ng/mL on 12/15/2012  2.0 ng/mL on 06/13/2013  1.7 ng/mL on 12/24/2013  1.9 (3.8) ng/mL on 06/13/2014  1.6 (3.2) ng/mL on 11/24/2015             1.6 (3.2) ng/mL on 05/26/2016  2.1 (4.2) ng/mL on 11/26/2016  2.0 (4.0) in 11/2016  2.3 (4.6) in 01/2017  1.2 (2.4) in 04/2017  2.0 (4.0) in 10/2017  1.3 (2.6) in 01/2018  1.8 (3.6) in 04/2018  Erectile dysfunction His SHIM score is 13, which is mild-moderate.  His previous SHIM score was 8.  He has been having difficulty with erections for two years.  His major complaint is premature ejaculation.  His libido is preserved.  His risk factors for ED are age, BPH, DM, HTN, and HLD.  He denies any painful erections or curvatures with his erections.   He is no longer having spontaneous erections.  SHIM    Row Name 05/05/18 [phone]         SHIM: Over the last 6 months:   How do you rate your confidence that you could get and keep an erection?  Low     When you had erections with sexual stimulation, how often were your erections hard enough for penetration (entering your partner)?  Sometimes (about half the time)     During sexual intercourse, how often were you able to maintain your erection after you had penetrated (entered) your partner?  A Few Times (much less than half the time)     During sexual intercourse, how difficult was it to maintain your erection to completion of intercourse?  Very Difficult     When you  attempted sexual intercourse, how often was it satisfactory for you?  Most Times (much more than half the time)       SHIM Total Score   SHIM  13        Score: 1-7 Severe ED 8-11 Moderate ED 12-16 Mild-Moderate ED 17-21 Mild ED 22-25 No ED  Knot on testicle  Patient stated on his 11/01/2017 visit that he was no longer concerned with the knot in his left testicle as it had not caused him any discomfort.  He stated that he also has been performing exams on occasions and it has not been able to palpated at times.  On 05/05/2018, he reported that it has not changed.  BPH WITH LUTS  (prostate and/or bladder) IPSS score: 3/1  Previous score: 1/0  Major complaint(s):  None.  Denies any dysuria, hematuria or suprapubic pain.   Currently taking: Finasteride 5 mg daily.   Denies any recent fevers, chills, nausea or vomiting.  He does not have a family history of PCa.  IPSS    Row Name 05/05/18 0800         International Prostate Symptom Score   How often have you had the sensation of not emptying your bladder?  Not at All  How often have you had to urinate less than every two hours?  Less than half the time     How often have you found you stopped and started again several times when you urinated?  Not at All     How often have you found it difficult to postpone urination?  Not at All     How often have you had a weak urinary stream?  Not at All     How often have you had to strain to start urination?  Not at All     How many times did you typically get up at night to urinate?  1 Time     Total IPSS Score  3       Quality of Life due to urinary symptoms   If you were to spend the rest of your life with your urinary condition just the way it is now how would you feel about that?  Pleased        Score:  1-7 Mild 8-19 Moderate 20-35 Severe  PMH: Past Medical History:  Diagnosis Date  . BPH (benign prostatic hyperplasia)   . Elevated PSA   . Erectile dysfunction   .  Hyperlipidemia   . Hypertension   . Hypogonadism in male   . Metabolic syndrome   . Nocturia     Surgical History: Past Surgical History:  Procedure Laterality Date  . COLONOSCOPY WITH PROPOFOL N/A 08/22/2017   Procedure: COLONOSCOPY WITH PROPOFOL;  Surgeon: Jonathon Bellows, MD;  Location: [facility] ENDOSCOPY;  Service: Gastroenterology;  Laterality: N/A;  . FOOT SURGERY Left   . Left leg surgery    . VASECTOMY      Home Medications:  Allergies as of 05/05/2018   No Known Allergies     Medication List       Accurate as of May 05, 2018  8:26 AM. Always use your most recent med list.        amLODipine-olmesartan 10-40 MG tablet Commonly known as:  AZOR Take 1 tablet by mouth daily.   atorvastatin 20 MG tablet Commonly known as:  LIPITOR   diclofenac sodium 1 % Gel Commonly known as:  VOLTAREN   finasteride 5 MG tablet Commonly known as:  PROSCAR Take 1 tablet (5 mg total) by mouth daily.   hydrochlorothiazide 25 MG tablet Commonly known as:  HYDRODIURIL Take 1 tablet (25 mg total) by mouth daily.   omeprazole 20 MG capsule Commonly known as:  PRILOSEC   potassium chloride 10 MEQ tablet Commonly known as:  K-DUR 1 BY MOUTH DAILY FOR LOW POTASSIUM   rosuvastatin 5 MG tablet Commonly known as:  CRESTOR Take 1 tablet (5 mg total) by mouth at bedtime.   tadalafil 20 MG tablet Commonly known as:  ADCIRCA/CIALIS Take 1 tablet (20 mg total) by mouth daily as needed for erectile dysfunction.       Allergies: No Known Allergies  Family History: Family History  Problem Relation Age of Onset  . Prostate cancer Neg Hx   . Bladder Cancer Neg Hx   . Kidney disease Neg Hx   . Kidney cancer Neg Hx     Social History:  reports that he has quit smoking. He has quit using smokeless tobacco.  His smokeless tobacco use included chew. He reports that he does not drink alcohol or use drugs.  ROS: UROLOGY Frequent Urination?: No Hard to postpone urination?: No Burning/pain  with urination?: No Get up at night to urinate?: No Leakage  of urine?: No Urine stream starts and stops?: No Trouble starting stream?: No Do you have to strain to urinate?: No Blood in urine?: No Urinary tract infection?: No Sexually transmitted disease?: No Injury to kidneys or bladder?: No Painful intercourse?: No Weak stream?: No Erection problems?: No Penile pain?: No  Gastrointestinal Nausea?: No Vomiting?: No Indigestion/heartburn?: No Diarrhea?: No Constipation?: No  Constitutional Fever: No Night sweats?: No Weight loss?: No Fatigue?: No  Skin Skin rash/lesions?: No Itching?: No  Eyes Blurred vision?: No Double vision?: No  Ears/Nose/Throat Sore throat?: No Sinus problems?: No  Hematologic/Lymphatic Swollen glands?: No Easy bruising?: No  Cardiovascular Leg swelling?: No Chest pain?: No  Respiratory Cough?: No Shortness of breath?: No  Endocrine Excessive thirst?: No  Musculoskeletal Back pain?: No Joint pain?: No  Neurological Headaches?: No Dizziness?: No  Psychologic Depression?: No Anxiety?: No  Physical Exam: BP 121/75 (BP Location: Left Arm, Patient Position: Sitting)   Pulse 60   Ht 5\' 8"  (1.727 m)   Wt 199 lb (90.3 kg)   BMI 30.26 kg/m   Constitutional:  Well nourished. Alert and oriented, No acute distress. HEENT: [place] AT, moist mucus membranes.  Trachea midline. Cardiovascular: No clubbing, cyanosis, or edema. Respiratory: Normal respiratory effort, no increased work of breathing. GI: Abdomen is soft, non tender, non distended, no abdominal masses. Liver and spleen not palpable.  Small umbilical hernia appreciated.  Stool sample for occult testing is not indicated. GU: No CVA tenderness.  No bladder fullness or masses.  Patient with circumcised phallus.  Urethral meatus is patent.  No penile discharge. No penile lesions or rashes. Scrotum without lesions, cysts, rashes and/or edema.  Testicles are located scrotally  bilaterally. No masses are appreciated in the testicles. Left and right epididymis are normal. Rectal: Patient with normal sphincter tone.  Anus and perineum without scarring or rashes.  No rectal masses are appreciated. Prostate is approximately 45 grams, no nodules are appreciated.  Seminal vesicles are normal. Skin: No rashes, bruises or suspicious lesions. Lymph: No inguinal adenopathy. Neurologic: Grossly intact, no focal deficits, moving all 4 extremities. Psychiatric: Normal mood and affect.  Laboratory Data:  PSA History:  10.3 ng/mL on 01/13/2012  bx-neg on 03/09/2012  3.1 ng/mL on 06/15/2012  1.6 ng/mL on 12/15/2012  2.0 ng/mL on 06/13/2013  1.7 ng/mL on 12/24/2013  1.9 (3.8) ng/mL on 06/13/2014  1.6 (3.2) ng/mL on 11/24/2015             1.6 (3.2) ng/mL on 05/26/2016  2.1 (4.2) ng/mL on 11/26/2016  2.0 (4.0) ng/mL on 11/2016  2.3 (4.6) ng/mL on 01/2017  1.2 (2.4) ng/mL on 04/2017  2.0 (4.0) ng/mL on 10/2017  1.3 (2.6) ng/mL on 01/2018  1.8 (3.6) ng/mL on 04/2017  I have reviewed the labs.  Assessment & Plan:    1. History of elevated PSA - Current PSA 1.8 (3.6) in 04/2018 - Continue finasteride 5 mg daily      2. Erectile dysfunction - SHIM score is 13, it is worsening - Has not taken PDE5i at this time  - Not interested in therapy at this time  3.  Left epididymal cysts - Unchanged  4. BPH with LUTS - IPSS score is 3/1, it is worsening - Continue conservative management, avoiding bladder irritants and timed voiding's - Continue finasteride 5 mg daily - RTC in 6 months for IPSS, PSA and exam   Return in about 6 months (around 11/05/2018) for IPSS, SHIM, PSA and exam.  Merville Hijazi, PA-C  [place], [place] [address] [phone]  I, Adele Schilder, am acting as a Education administrator for [employer], PA-C.   I have reviewed the above documentation for accuracy and completeness, and I agree with  the above.    Zara Council, PA-C

## 2018-05-04 LAB — PSA: Prostate Specific Ag, Serum: 1.8 ng/mL (ref 0.0–4.0)

## 2018-05-05 ENCOUNTER — Ambulatory Visit (INDEPENDENT_AMBULATORY_CARE_PROVIDER_SITE_OTHER): Payer: BLUE CROSS/BLUE SHIELD | Admitting: Urology

## 2018-05-05 ENCOUNTER — Encounter: Payer: Self-pay | Admitting: Urology

## 2018-05-05 VITALS — BP 121/75 | HR 60 | Ht 68.0 in | Wt 199.0 lb

## 2018-05-05 DIAGNOSIS — N138 Other obstructive and reflux uropathy: Secondary | ICD-10-CM

## 2018-05-05 DIAGNOSIS — Z87898 Personal history of other specified conditions: Secondary | ICD-10-CM | POA: Diagnosis not present

## 2018-05-05 DIAGNOSIS — N401 Enlarged prostate with lower urinary tract symptoms: Secondary | ICD-10-CM

## 2018-05-05 DIAGNOSIS — N529 Male erectile dysfunction, unspecified: Secondary | ICD-10-CM

## 2018-11-03 ENCOUNTER — Other Ambulatory Visit: Payer: Self-pay | Admitting: Family Medicine

## 2018-11-03 DIAGNOSIS — Z87898 Personal history of other specified conditions: Secondary | ICD-10-CM

## 2018-11-07 ENCOUNTER — Other Ambulatory Visit: Payer: Self-pay

## 2018-11-07 ENCOUNTER — Other Ambulatory Visit: Payer: BC Managed Care – PPO

## 2018-11-07 DIAGNOSIS — Z87898 Personal history of other specified conditions: Secondary | ICD-10-CM

## 2018-11-08 LAB — PSA: Prostate Specific Ag, Serum: 1.7 ng/mL (ref 0.0–4.0)

## 2018-11-09 NOTE — Progress Notes (Signed)
05/05/2018 9:13 AM   Devon Erickson October 10, 1960 MD:[id number]  Referring provider: [facility], [address] [place],  [place] [address]  Chief Complaint  Patient presents with  . Benign Prostatic Hypertrophy    HPI: Devon Erickson is a 58 y.o. male male with a history of elevated PSA, erectile dysfunction and BPH with LUTS who presents today for 6 month follow-up.  History of elevated PSA  10.3 ng/mL on 01/13/2012  bx-neg on 03/09/2012  - started finasteride   3.1  ng/mL on 06/15/2012  1.6 ng/mL on 12/15/2012  2.0 ng/mL on 06/13/2013  1.7 ng/mL on 12/24/2013  1.9 (3.8) ng/mL on 06/13/2014  1.6 (3.2) ng/mL on 11/24/2015             1.6 (3.2) ng/mL on 05/26/2016  2.1 (4.2) ng/mL on 11/26/2016  2.0 (4.0) in 11/2016  2.3 (4.6) in 01/2017  1.2 (2.4) in 04/2017  2.0 (4.0) in 10/2017  1.3 (2.6) in 01/2018  1.8 (3.6) in 04/2018  1.7 (3.4) in 10/2018  Knot on testicle  Likely varicocele. No reports of pain or change in the testicle.    BPH WITH LUTS  (prostate and/or bladder) IPSS score: 0/1     Previous score: 3/1  Major complaint(s):  None.  Patient denies any gross hematuria, dysuria or suprapubic/flank pain.  Patient denies any fevers, chills, nausea or vomiting.    Currently taking: Finasteride 5 mg daily.  He does not have a family history of PCa.  IPSS    Row Name 11/10/18 0900         International Prostate Symptom Score   How often have you had the sensation of not emptying your bladder?  Not at All     How often have you had to urinate less than every two hours?  Not at All     How often have you found you stopped and started again several times when you urinated?  Not at All     How often have you found it difficult to postpone urination?  Not at All     How often have you had a weak urinary stream?  Not at All     How often have you had to strain to start urination?  Not at All     How many times did you typically  get up at night to urinate?  None     Total IPSS Score  0       Quality of Life due to urinary symptoms   If you were to spend the rest of your life with your urinary condition just the way it is now how would you feel about that?  Pleased        Score:  1-7 Mild 8-19 Moderate 20-35 Severe  PMH: Past Medical History:  Diagnosis Date  . BPH (benign prostatic hyperplasia)   . Elevated PSA   . Erectile dysfunction   . Hyperlipidemia   . Hypertension   . Hypogonadism in male   . Metabolic syndrome   . Nocturia     Surgical History: Past Surgical History:  Procedure Laterality Date  . COLONOSCOPY WITH PROPOFOL N/A 08/22/2017   Procedure: COLONOSCOPY WITH PROPOFOL;  Surgeon: Jonathon Bellows, MD;  Location: [facility] ENDOSCOPY;  Service: Gastroenterology;  Laterality: N/A;  . FOOT SURGERY Left   . Left leg surgery    . VASECTOMY      Home Medications:  Allergies as of 11/10/2018  No Known Allergies     Medication List       Accurate as of November 10, 2018  9:13 AM. If you have any questions, ask your nurse or doctor.        amLODipine-olmesartan 10-40 MG tablet Commonly known as: AZOR Take 1 tablet by mouth daily.   atorvastatin 20 MG tablet Commonly known as: LIPITOR   diclofenac sodium 1 % Gel Commonly known as: VOLTAREN   finasteride 5 MG tablet Commonly known as: PROSCAR Take 1 tablet (5 mg total) by mouth daily.   hydrochlorothiazide 25 MG tablet Commonly known as: HYDRODIURIL Take 1 tablet (25 mg total) by mouth daily.   omeprazole 20 MG capsule Commonly known as: PRILOSEC   potassium chloride 10 MEQ tablet Commonly known as: K-DUR 1 BY MOUTH DAILY FOR LOW POTASSIUM   rosuvastatin 5 MG tablet Commonly known as: CRESTOR Take 1 tablet (5 mg total) by mouth at bedtime.   tadalafil 20 MG tablet Commonly known as: CIALIS Take 1 tablet (20 mg total) by mouth daily as needed for erectile dysfunction.       Allergies: No Known Allergies  Family  History: Family History  Problem Relation Age of Onset  . Prostate cancer Neg Hx   . Bladder Cancer Neg Hx   . Kidney disease Neg Hx   . Kidney cancer Neg Hx     Social History:  reports that he has quit smoking. He has quit using smokeless tobacco.  His smokeless tobacco use included chew. He reports that he does not drink alcohol or use drugs.  ROS: UROLOGY Frequent Urination?: No Hard to postpone urination?: No Burning/pain with urination?: No Get up at night to urinate?: No Leakage of urine?: No Urine stream starts and stops?: No Trouble starting stream?: No Do you have to strain to urinate?: No Blood in urine?: No Urinary tract infection?: No Sexually transmitted disease?: No Injury to kidneys or bladder?: No Painful intercourse?: No Weak stream?: No Erection problems?: No Penile pain?: No  Gastrointestinal Nausea?: No Vomiting?: No Indigestion/heartburn?: No Diarrhea?: No Constipation?: No  Constitutional Fever: No Night sweats?: No Weight loss?: No Fatigue?: No  Skin Skin rash/lesions?: No Itching?: No  Eyes Blurred vision?: No Double vision?: No  Ears/Nose/Throat Sore throat?: No Sinus problems?: No  Hematologic/Lymphatic Swollen glands?: No Easy bruising?: No  Cardiovascular Leg swelling?: No Chest pain?: No  Respiratory Cough?: No Shortness of breath?: No  Endocrine Excessive thirst?: No  Musculoskeletal Back pain?: No Joint pain?: No  Neurological Headaches?: No Dizziness?: No  Psychologic Depression?: No Anxiety?: No  Physical Exam: BP (!) 174/67 (BP Location: Left Arm, Patient Position: Sitting, Cuff Size: Normal)   Pulse 61   Ht 5\' 8"  (1.727 m)   Wt 192 lb (87.1 kg)   BMI 29.19 kg/m   Constitutional:  Well nourished. Alert and oriented, No acute distress. HEENT: [place] AT, moist mucus membranes.  Trachea midline, no masses. Cardiovascular: No clubbing, cyanosis, or edema. Respiratory: Normal respiratory effort, no  increased work of breathing. GI: Abdomen is soft, non tender, non distended, no abdominal masses. Liver and spleen not palpable.  Umbilical hernia appreciated.  Stool sample for occult testing is not indicated.   GU: No CVA tenderness.  No bladder fullness or masses.  Patient with circumcised phallus.  Urethral meatus is patent.  No penile discharge. No penile lesions or rashes. Scrotum without lesions, cysts, rashes and/or edema.  Testicles are located scrotally bilaterally. No masses are appreciated in the testicles.  Left and right epididymis are normal. Left varicocele noted.  Rectal: Refused by patient - understands that some prostate cancer will not cause a rise in PSA and can be discovered by exam - he states he will have it examined when he returns in 6 months.  Skin: No rashes, bruises or suspicious lesions. Lymph: No inguinal adenopathy. Neurologic: Grossly intact, no focal deficits, moving all 4 extremities. Psychiatric: Normal mood and affect.  Laboratory Data: Results for orders placed or performed in visit on 11/07/18  PSA  Result Value Ref Range   Prostate Specific Ag, Serum 1.7 0.0 - 4.0 ng/mL   I have reviewed the labs.  Assessment & Plan:    1. History of elevated PSA - stable - Continue finasteride 5 mg daily      2.  Left Varicocele - Unchanged  3. BPH with LUTS - IPSS score is 0/1, it is improved  - Continue conservative management, avoiding bladder irritants and timed voiding's - Continue finasteride 5 mg daily - RTC in 6 months for IPSS, PSA and exam   Return in about 6 months (around 05/10/2019) for IPSS, PSA and exam.  Zara Council, [facility]  [place], [place] [address] [phone]

## 2018-11-10 ENCOUNTER — Encounter: Payer: Self-pay | Admitting: Urology

## 2018-11-10 ENCOUNTER — Other Ambulatory Visit: Payer: Self-pay

## 2018-11-10 ENCOUNTER — Ambulatory Visit (INDEPENDENT_AMBULATORY_CARE_PROVIDER_SITE_OTHER): Payer: BC Managed Care – PPO | Admitting: Urology

## 2018-11-10 VITALS — BP 174/67 | HR 61 | Ht 68.0 in | Wt 192.0 lb

## 2018-11-10 DIAGNOSIS — N529 Male erectile dysfunction, unspecified: Secondary | ICD-10-CM

## 2018-11-10 DIAGNOSIS — Z87898 Personal history of other specified conditions: Secondary | ICD-10-CM | POA: Diagnosis not present

## 2018-11-10 DIAGNOSIS — N401 Enlarged prostate with lower urinary tract symptoms: Secondary | ICD-10-CM | POA: Diagnosis not present

## 2018-11-10 DIAGNOSIS — N503 Cyst of epididymis: Secondary | ICD-10-CM | POA: Diagnosis not present

## 2018-11-10 DIAGNOSIS — N138 Other obstructive and reflux uropathy: Secondary | ICD-10-CM

## 2018-11-10 MED ORDER — FINASTERIDE 5 MG PO TABS: 5.0000 mg | ORAL_TABLET | Freq: Every day | ORAL | 3 refills | Status: DC

## 2019-02-01 ENCOUNTER — Other Ambulatory Visit: Payer: Self-pay

## 2019-02-01 DIAGNOSIS — Z20822 Contact with and (suspected) exposure to covid-19: Secondary | ICD-10-CM

## 2019-02-03 LAB — NOVEL CORONAVIRUS, NAA: SARS-CoV-2, NAA: NOT DETECTED

## 2019-05-07 ENCOUNTER — Other Ambulatory Visit: Payer: Self-pay | Admitting: *Deleted

## 2019-05-07 DIAGNOSIS — R972 Elevated prostate specific antigen [PSA]: Secondary | ICD-10-CM

## 2019-05-08 ENCOUNTER — Other Ambulatory Visit: Payer: 59

## 2019-05-08 ENCOUNTER — Other Ambulatory Visit: Payer: Self-pay

## 2019-05-08 DIAGNOSIS — R972 Elevated prostate specific antigen [PSA]: Secondary | ICD-10-CM

## 2019-05-09 LAB — PSA: Prostate Specific Ag, Serum: 1.5 ng/mL (ref 0.0–4.0)

## 2019-05-15 ENCOUNTER — Other Ambulatory Visit: Payer: Self-pay

## 2019-05-15 ENCOUNTER — Ambulatory Visit (INDEPENDENT_AMBULATORY_CARE_PROVIDER_SITE_OTHER): Payer: 59 | Admitting: Urology

## 2019-05-15 ENCOUNTER — Encounter: Payer: Self-pay | Admitting: Urology

## 2019-05-15 VITALS — BP 166/96 | HR 61 | Ht 68.0 in | Wt 210.0 lb

## 2019-05-15 DIAGNOSIS — Z87898 Personal history of other specified conditions: Secondary | ICD-10-CM

## 2019-05-15 DIAGNOSIS — N401 Enlarged prostate with lower urinary tract symptoms: Secondary | ICD-10-CM | POA: Diagnosis not present

## 2019-05-15 DIAGNOSIS — N529 Male erectile dysfunction, unspecified: Secondary | ICD-10-CM | POA: Diagnosis not present

## 2019-05-15 DIAGNOSIS — N138 Other obstructive and reflux uropathy: Secondary | ICD-10-CM

## 2019-05-15 MED ORDER — FINASTERIDE 5 MG PO TABS
5.0000 mg | ORAL_TABLET | Freq: Every day | ORAL | 3 refills | Status: DC
Start: 2019-05-15 — End: 2020-05-27

## 2019-05-15 NOTE — Progress Notes (Signed)
05/05/2018 8:51 AM   Devon Erickson [birth year] MD:[id number]  Referring provider: Center, [address] [place],  [place] [address]  Chief Complaint  Patient presents with  . Elevated PSA    HPI: Devon Erickson is a 59 y.o. male male with a history of elevated PSA, erectile dysfunction and BPH with LUTS who presents today for 6 month follow-up.  History of elevated PSA  10.3 ng/mL on 01/13/2012  bx-neg on 03/09/2012  - started finasteride   3.1  ng/mL on 06/15/2012  1.6 ng/mL on 12/15/2012  2.0 ng/mL on 06/13/2013  1.7 ng/mL on 12/24/2013  1.9 (3.8) ng/mL on 06/13/2014  1.6 (3.2) ng/mL on 11/24/2015             1.6 (3.2) ng/mL on 05/26/2016  2.1 (4.2) ng/mL on 11/26/2016  2.0 (4.0) in 11/2016  2.3 (4.6) in 01/2017  1.2 (2.4) in 04/2017  2.0 (4.0) in 10/2017  1.3 (2.6) in 01/2018  1.8 (3.6) in 04/2018  1.7 (3.4) in 10/2018  1.5 (3.0) in 04/2019  BPH WITH LUTS  (prostate and/or bladder) IPSS score: 1/1     Previous score: 0/1  Major complaint(s):  None.  Patient denies any modifying or aggravating factors.  Patient denies any gross hematuria, dysuria or suprapubic/flank pain.  Patient denies any fevers, chills, nausea or vomiting.   Currently taking: Finasteride 5 mg daily.  He does not have a family history of PCa.  IPSS    Row Name 05/15/19 0800         International Prostate Symptom Score   How often have you had the sensation of not emptying your bladder?  Not at All     How often have you had to urinate less than every two hours?  Less than 1 in 5 times     How often have you found you stopped and started again several times when you urinated?  Not at All     How often have you found it difficult to postpone urination?  Not at All     How often have you had a weak urinary stream?  Not at All     How often have you had to strain to start urination?  Not at All     How many times did you typically get up at night  to urinate?  None     Total IPSS Score  1       Quality of Life due to urinary symptoms   If you were to spend the rest of your life with your urinary condition just the way it is now how would you feel about that?  Pleased        Score:  1-7 Mild 8-19 Moderate 20-35 Severe  PMH: Past Medical History:  Diagnosis Date  . BPH (benign prostatic hyperplasia)   . Elevated PSA   . Erectile dysfunction   . Hyperlipidemia   . Hypertension   . Hypogonadism in male   . Metabolic syndrome   . Nocturia     Surgical History: Past Surgical History:  Procedure Laterality Date  . COLONOSCOPY WITH PROPOFOL N/A 08/22/2017   Procedure: COLONOSCOPY WITH PROPOFOL;  Surgeon: Jonathon Bellows, MD;  Location: [facility] ENDOSCOPY;  Service: Gastroenterology;  Laterality: N/A;  . FOOT SURGERY Left   . Left leg surgery    . VASECTOMY      Home Medications:  Allergies as of 05/15/2019   No Known Allergies  Medication List       Accurate as of May 15, 2019  8:51 AM. If you have any questions, ask your nurse or doctor.        STOP taking these medications   tadalafil 20 MG tablet Commonly known as: CIALIS Stopped by: Nalleli Largent, PA-C     TAKE these medications   amLODipine-olmesartan 10-40 MG tablet Commonly known as: AZOR Take 1 tablet by mouth daily.   atorvastatin 20 MG tablet Commonly known as: LIPITOR   diclofenac sodium 1 % Gel Commonly known as: VOLTAREN   finasteride 5 MG tablet Commonly known as: PROSCAR Take 1 tablet (5 mg total) by mouth daily.   hydrochlorothiazide 25 MG tablet Commonly known as: HYDRODIURIL Take 1 tablet (25 mg total) by mouth daily.   omeprazole 20 MG capsule Commonly known as: PRILOSEC   potassium chloride 10 MEQ tablet Commonly known as: KLOR-CON 1 BY MOUTH DAILY FOR LOW POTASSIUM   rosuvastatin 5 MG tablet Commonly known as: CRESTOR Take 1 tablet (5 mg total) by mouth at bedtime.       Allergies: No Known Allergies  Family  History: Family History  Problem Relation Age of Onset  . Prostate cancer Neg Hx   . Bladder Cancer Neg Hx   . Kidney disease Neg Hx   . Kidney cancer Neg Hx     Social History:  reports that he has quit smoking. He has quit using smokeless tobacco.  His smokeless tobacco use included chew. He reports that he does not drink alcohol or use drugs.  ROS: For pertinent review of systems please refer to history of present illness  Physical Exam: BP (!) 166/96   Pulse 61   Ht 5\' 8"  (1.727 m)   Wt 210 lb (95.3 kg)   BMI 31.93 kg/m   Constitutional:  Well nourished. Alert and oriented, No acute distress. HEENT: [place] AT, mask in place.  Trachea midline, no masses. Cardiovascular: No clubbing, cyanosis, or edema. Respiratory: Normal respiratory effort, no increased work of breathing. GI: Abdomen is soft, non tender, non distended, no abdominal masses. Liver and spleen not palpable.  No hernias appreciated.  Stool sample for occult testing is not indicated.   GU: No CVA tenderness.  No bladder fullness or masses.  Patient with circumcised phallus.  Urethral meatus is patent.  No penile discharge. No penile lesions or rashes. Scrotum without lesions, cysts, rashes and/or edema.  Testicles are located scrotally bilaterally. No masses are appreciated in the testicles. Left and right epididymis are normal.  Bilateral varicoceles noted.  Rectal: Patient with  normal sphincter tone. Anus and perineum without scarring or rashes. A rubbery lump is noted at the 9 o'clock position at the anus.  No rectal masses are appreciated. Prostate is approximately 45 grams, no nodules are appreciated. Seminal vesicles could not be palpated.  Skin: No rashes, bruises or suspicious lesions. Lymph: No inguinal adenopathy. Neurologic: Grossly intact, no focal deficits, moving all 4 extremities. Psychiatric: Normal mood and affect.  Laboratory Data: See HPI I have reviewed the labs.  Assessment & Plan:    1. BPH  with LUTS IPSS score is 1/1, it is stable Continue conservative management, avoiding bladder irritants and timed voiding's Continue finasteride 5 mg daily - refills given RTC in 12 months for IPSS, PSA and exam   2. Bilateral varicoceles Not bothersome Continue to monitor  3. History of elevated PSA Stable RTC in one year for PSA  4. External hemorrhoid Refer on  to GI as this is bothersome to the patient  Return in about 1 year (around 05/14/2020) for IPSS, PSA and exam.  Zara Council, [facility]  [place], [place] [address] [phone]

## 2019-05-25 ENCOUNTER — Ambulatory Visit: Payer: Self-pay | Admitting: Urology

## 2019-07-05 ENCOUNTER — Other Ambulatory Visit: Payer: Self-pay

## 2019-07-05 ENCOUNTER — Ambulatory Visit (INDEPENDENT_AMBULATORY_CARE_PROVIDER_SITE_OTHER): Payer: 59 | Admitting: Gastroenterology

## 2019-07-05 VITALS — BP 161/91 | HR 55 | Temp 98.0°F | Ht 68.0 in | Wt 200.0 lb

## 2019-07-05 DIAGNOSIS — R1084 Generalized abdominal pain: Secondary | ICD-10-CM

## 2019-07-05 DIAGNOSIS — Z791 Long term (current) use of non-steroidal anti-inflammatories (NSAID): Secondary | ICD-10-CM

## 2019-07-05 MED ORDER — OMEPRAZOLE 40 MG PO CPDR: 40.0000 mg | DELAYED_RELEASE_CAPSULE | Freq: Every day | ORAL | 3 refills | Status: AC

## 2019-07-05 NOTE — Progress Notes (Signed)
Jonathon Bellows MD, MRCP(U.K) [address]  [place]  [place], [place] [address]  Main: [phone]  Fax: [phone]   Gastroenterology Consultation  Referring Provider:     Laneta Simmers Primary Care Physician:  [facility], [place] Primary Gastroenterologist:  Dr. Jonathon Bellows  Reason for Consultation:     Polyp on anus        HPI:   Devon Erickson is a 59 y.o. y/o male refer to the report polyp on anus and internal hemorrhoids.  I performed a colonoscopy in June 2019 for colon cancer screening average risk.  2 sessile polyps were resected in the ascending colon which were 3 to 4 mm in size.  A 5 mm polyp was resected in the transverse colon that was sessile.  Looking back at his rectal images it is possible that he has internal hemorrhoids.  Pathology report suggested that all 3 polyps resected were tubular adenomas which were recommend him to return in 3 years for colonoscopy but will be in June 2022   He says that for the past few months he has felt a bump on his anus.  Comes in and out, bothersome but does not hurt.  Does not itch.  No bleeding.  He has used Preparation H.  Issues still persists he also complains of generalized lower abdominal pain on and off ongoing for a few months not related to food intake and not relieved with a bowel movement.  He has a bowel movement at least 2 times a day which is soft.  He has been taking up to 8 Aleve's a day for body pains.  Not on any PPI.  No other complaint.  Past Medical History:  Diagnosis Date  . BPH (benign prostatic hyperplasia)   . Elevated PSA   . Erectile dysfunction   . Hyperlipidemia   . Hypertension   . Hypogonadism in male   . Metabolic syndrome   . Nocturia     Past Surgical History:  Procedure Laterality Date  . COLONOSCOPY WITH PROPOFOL N/A 08/22/2017   Procedure: COLONOSCOPY WITH PROPOFOL;  Surgeon: Jonathon Bellows, MD;  Location: [facility] ENDOSCOPY;  Service: Gastroenterology;   Laterality: N/A;  . FOOT SURGERY Left   . Left leg surgery    . VASECTOMY      Prior to Admission medications   Medication Sig Start Date End Date Taking? Authorizing Provider  amLODipine-olmesartan (AZOR) 10-40 MG tablet Take 1 tablet by mouth daily. 11/11/16   Roselee Nova, MD  atorvastatin (LIPITOR) 20 MG tablet  05/06/17   [provider]  diclofenac sodium (VOLTAREN) 1 % GEL  05/02/18   [provider]  finasteride (PROSCAR) 5 MG tablet Take 1 tablet (5 mg total) by mouth daily. 05/15/19   Zara Council A, PA-C  hydrochlorothiazide (HYDRODIURIL) 25 MG tablet Take 1 tablet (25 mg total) by mouth daily. 06/09/16   Roselee Nova, MD  omeprazole (PRILOSEC) 20 MG capsule  01/30/18   [provider]  potassium chloride (K-DUR) 10 MEQ tablet 1 BY MOUTH DAILY FOR LOW POTASSIUM 04/08/18   [provider]  rosuvastatin (CRESTOR) 5 MG tablet Take 1 tablet (5 mg total) by mouth at bedtime. 11/11/16   Roselee Nova, MD    Family History  Problem Relation Age of Onset  . Prostate cancer Neg Hx   . Bladder Cancer Neg Hx   . Kidney disease Neg Hx   . Kidney cancer  Neg Hx      Social History   Tobacco Use  . Smoking status: Former Research scientist (life sciences)  . Smokeless tobacco: Former Systems developer    Types: Chew  . Tobacco comment: quit 1996  Substance Use Topics  . Alcohol use: No    Alcohol/week: 0.0 standard drinks  . Drug use: No    Allergies as of 07/05/2019  . (No Known Allergies)    Review of Systems:    All systems reviewed and negative except where noted in HPI.   Physical Exam:  There were no vitals taken for this visit. No LMP for male patient. Psych:  Alert and cooperative. Normal mood and affect. General:   Alert,  Well-developed, well-nourished, pleasant and cooperative in NAD Head:  Normocephalic and atraumatic. Eyes:  Sclera clear, no icterus.   Conjunctiva pink. Ears:  Normal auditory acuity. Abdomen:  Normal bowel sounds.  No bruits.  Soft,  non-tender and non-distended without masses, hepatosplenomegaly or hernias noted.  No guarding or rebound tenderness.    Neurologic:  Alert and oriented x3;  grossly normal neurologically. Psych:  Alert and cooperative. Normal mood and affect. Chaperone present.  Rectal exam performed no masses or abnormalities seen externally or internally PROCEDURE NOTE: The patient presents with symptomatic grade 2 hemorrhoids, unresponsive to maximal medical therapy, requesting rubber band ligation of his/her hemorrhoidal disease.  All risks, benefits and alternative forms of therapy were described and informed consent was obtained.  In the Left Lateral Decubitus position (if anoscopy is performed) anoscopic examination revealed grade 2 hemorrhoids in the all position(s).   The decision was made to band the RA internal hemorrhoid, and the Christian was used to perform band ligation without complication.  Digital anorectal examination was then performed to assure proper positioning of the band, and to adjust the banded tissue as required.  The patient was discharged home without pain or other issues.  Dietary and behavioral recommendations were given and (if necessary - prescriptions were given), along with follow-up instructions.  The patient will return 4 weeks for follow-up and possible additional banding as required.  No complications were encountered and the patient tolerated the procedure well.    Imaging Studies: No results found.  Assessment and Plan:   Devon Erickson is a 59 y.o. y/o male has been referred for a polyp of his anus.  Also complains of lower abdominal pain which is suggestive of NSAID related possible gastritis.  Prolapsing grade 2 internal hemorrhoids noted right anterior column was banded today.  Plan 1.  Stop all NSAID use, check H. pylori breath test, commence on omeprazole 40 mg once a day if no better at next visit we will consider EGD/CT abdomen. 2.  High-fiber  diet and stool softeners if has rectal pain to call up right away  Follow up in 4 weeks  Dr Jonathon Bellows MD,MRCP(U.K)

## 2019-07-07 LAB — H. PYLORI BREATH TEST: H pylori Breath Test: POSITIVE — AB

## 2019-07-09 ENCOUNTER — Telehealth: Payer: Self-pay

## 2019-07-09 DIAGNOSIS — A048 Other specified bacterial intestinal infections: Secondary | ICD-10-CM

## 2019-07-09 NOTE — Telephone Encounter (Signed)
Called pt to inform him of lab result and Dr. Georgeann Oppenheim recommendations.  Unable to contact, LVM to return call

## 2019-07-09 NOTE — Telephone Encounter (Signed)
-----   Message from Jonathon Bellows, MD sent at 07/08/2019 12:55 PM EDT ----- Inform  H pylori positive   Suggest clarithromycin 500 mg PO BID, amoxicillin 1 gram TID, omeprazole 20 mg BID all for 14 days.  , check for penicillin allergy and will need repeat H pylori stool antigen to check for eradication after .

## 2019-07-11 NOTE — Telephone Encounter (Signed)
Called pt again to inform him of lab result and to discuss treatment.  Unable to contact, LVM to return call

## 2019-07-11 NOTE — Telephone Encounter (Signed)
-----   Message from Jonathon Bellows, MD sent at 07/08/2019 12:55 PM EDT ----- Inform  H pylori positive   Suggest clarithromycin 500 mg PO BID, amoxicillin 1 gram TID, omeprazole 20 mg BID all for 14 days.  , check for penicillin allergy and will need repeat H pylori stool antigen to check for eradication after .

## 2019-07-16 NOTE — Telephone Encounter (Signed)
-----   Message from Jonathon Bellows, MD sent at 07/08/2019 12:55 PM EDT ----- Inform  H pylori positive   Suggest clarithromycin 500 mg PO BID, amoxicillin 1 gram TID, omeprazole 20 mg BID all for 14 days.  , check for penicillin allergy and will need repeat H pylori stool antigen to check for eradication after .

## 2019-07-17 MED ORDER — AMOXICILLIN 500 MG PO CAPS: 1000.0000 mg | ORAL_CAPSULE | Freq: Three times a day (TID) | ORAL | 0 refills | Status: AC

## 2019-07-17 MED ORDER — CLARITHROMYCIN 500 MG PO TABS: 500.0000 mg | ORAL_TABLET | Freq: Two times a day (BID) | ORAL | 0 refills | Status: AC

## 2019-07-17 NOTE — Telephone Encounter (Signed)
Spoke with pt and informed him of lab results and the plan of treatment and also advised pt to stop the Atorvastatin while taking the clarithromycin for 14 days . Pt understands and agrees.

## 2019-07-17 NOTE — Addendum Note (Signed)
Addended by: Dorethea Clan on: 07/17/2019 05:02 PM   Modules accepted: Orders

## 2019-07-17 NOTE — Telephone Encounter (Signed)
-----   Message from Jonathon Bellows, MD sent at 07/08/2019 12:55 PM EDT ----- Inform  H pylori positive   Suggest clarithromycin 500 mg PO BID, amoxicillin 1 gram TID, omeprazole 20 mg BID all for 14 days.  , check for penicillin allergy and will need repeat H pylori stool antigen to check for eradication after .

## 2019-08-08 ENCOUNTER — Ambulatory Visit: Payer: Self-pay | Admitting: Gastroenterology

## 2019-09-25 ENCOUNTER — Ambulatory Visit: Payer: 59 | Admitting: Gastroenterology

## 2020-05-14 ENCOUNTER — Other Ambulatory Visit: Payer: 59

## 2020-05-14 ENCOUNTER — Other Ambulatory Visit: Payer: Self-pay

## 2020-05-14 ENCOUNTER — Other Ambulatory Visit: Payer: Self-pay | Admitting: Family Medicine

## 2020-05-14 DIAGNOSIS — N138 Other obstructive and reflux uropathy: Secondary | ICD-10-CM

## 2020-05-14 DIAGNOSIS — N401 Enlarged prostate with lower urinary tract symptoms: Secondary | ICD-10-CM

## 2020-05-15 LAB — PSA: Prostate Specific Ag, Serum: 1.7 ng/mL (ref 0.0–4.0)

## 2020-05-26 NOTE — Progress Notes (Signed)
05/05/2018 3:22 PM   Devon Erickson 17-Sep-1960 [id number]  Referring provider: [facility] [place],  [place] [address]  Chief Complaint  Patient presents with  . Benign Prostatic Hypertrophy   Urological history: 1. Elevated PSA -PSA trend  10.3 ng/mL on 01/13/2012  bx-neg on 03/09/2012  - started finasteride   3.1  ng/mL on 06/15/2012  1.6 ng/mL on 12/15/2012  2.0 ng/mL on 06/13/2013  1.7 ng/mL on 12/24/2013  1.9 (3.8) ng/mL on 06/13/2014  1.6 (3.2) ng/mL on 11/24/2015             1.6 (3.2) ng/mL on 05/26/2016  2.1 (4.2) ng/mL on 11/26/2016  2.0 (4.0) in 11/2016  2.3 (4.6) in 01/2017  1.2 (2.4) in 04/2017  2.0 (4.0) in 10/2017  1.3 (2.6) in 01/2018  1.8 (3.6) in 04/2018  1.7 (3.4) in 10/2018  1.5 (3.0) in 04/2019  1.7 (3.4) in 04/2020  2. BPH with LU TS - I PSS 1/0 -managed with finasteride 5 mg daily  3. ED -SHIM 19 -contributing factors of age, BPH, HLD and HTN  4. Premature ejaculation -resolved  5. Left epididymal cyst   6. Bilateral varicoceles   HPI: Devon Erickson is a 60 y.o. male male who presents today for follow up.   He has no urinary complaints at this time.  Patient denies any modifying or aggravating factors.  Patient denies any gross hematuria, dysuria or suprapubic/flank pain.  Patient denies any fevers, chills, nausea or vomiting.   UA negative.    IPSS    Row Name 05/27/20 0800         International Prostate Symptom Score   How often have you had the sensation of not emptying your bladder? Not at All     How often have you had to urinate less than every two hours? Less than 1 in 5 times     How often have you found you stopped and started again several times when you urinated? Not at All     How often have you found it difficult to postpone urination? Not at All     How often have you had a weak urinary stream? Not at All     How often have you had to strain to start  urination? Not at All     How many times did you typically get up at night to urinate? None     Total IPSS Score 1           Quality of Life due to urinary symptoms   If you were to spend the rest of your life with your urinary condition just the way it is now how would you feel about that? Delighted            Score:  1-7 Mild 8-19 Moderate 20-35 Severe   Patient is no longer having spontaneous erections. He denies any pain or curvature with erections.   SHIM    Row Name 05/27/20 0834         SHIM: Over the last 6 months:   How do you rate your confidence that you could get and keep an erection? Moderate     When you had erections with sexual stimulation, how often were your erections hard enough for penetration (entering your partner)? Most Times (much more than half the time)     During sexual intercourse, how often were you able to maintain your erection after you had  penetrated (entered) your partner? Sometimes (about half the time)     During sexual intercourse, how difficult was it to maintain your erection to completion of intercourse? Slightly Difficult     When you attempted sexual intercourse, how often was it satisfactory for you? Almost Always or Always           SHIM Total Score   SHIM 19            Score: 1-7 Severe ED 8-11 Moderate ED 12-16 Mild-Moderate ED 17-21 Mild ED 22-25 No ED  PMH: Past Medical History:  Diagnosis Date  . BPH (benign prostatic hyperplasia)   . Elevated PSA   . Erectile dysfunction   . Hyperlipidemia   . Hypertension   . Hypogonadism in male   . Metabolic syndrome   . Nocturia     Surgical History: Past Surgical History:  Procedure Laterality Date  . COLONOSCOPY WITH PROPOFOL N/A 08/22/2017   Procedure: COLONOSCOPY WITH PROPOFOL;  Surgeon: Jonathon Bellows, MD;  Location: [facility] ENDOSCOPY;  Service: Gastroenterology;  Laterality: N/A;  . FOOT SURGERY Left   . Left leg surgery    . VASECTOMY      Home Medications:   Allergies as of 05/27/2020   No Known Allergies     Medication List       Accurate as of May 27, 2020 11:59 PM. If you have any questions, ask your nurse or doctor.        STOP taking these medications   diclofenac sodium 1 % Gel Commonly known as: VOLTAREN Stopped by: Lynnsey Barbara, PA-C     TAKE these medications   amLODipine-olmesartan 10-40 MG tablet Commonly known as: AZOR Take 1 tablet by mouth daily.   atorvastatin 20 MG tablet Commonly known as: LIPITOR   diclofenac 75 MG EC tablet Commonly known as: VOLTAREN Take 75 mg by mouth 2 (two) times daily.   dicyclomine 20 MG tablet Commonly known as: BENTYL   finasteride 5 MG tablet Commonly known as: PROSCAR Take 1 tablet (5 mg total) by mouth daily.   hydrochlorothiazide 25 MG tablet Commonly known as: HYDRODIURIL Take 1 tablet (25 mg total) by mouth daily.   omeprazole 40 MG capsule Commonly known as: PRILOSEC Take 1 capsule (40 mg total) by mouth daily.   potassium chloride 10 MEQ tablet Commonly known as: KLOR-CON 1 BY MOUTH DAILY FOR LOW POTASSIUM   rosuvastatin 5 MG tablet Commonly known as: CRESTOR Take 1 tablet (5 mg total) by mouth at bedtime.       Allergies: No Known Allergies  Family History: Family History  Problem Relation Age of Onset  . Prostate cancer Neg Hx   . Bladder Cancer Neg Hx   . Kidney disease Neg Hx   . Kidney cancer Neg Hx     Social History:  reports that he has quit smoking. He has quit using smokeless tobacco.  His smokeless tobacco use included chew. He reports that he does not drink alcohol and does not use drugs.  ROS: For pertinent review of systems please refer to history of present illness  Physical Exam: BP (!) 148/87   Pulse 74   Ht 5\' 8"  (1.727 m)   Wt 195 lb (88.5 kg)   BMI 29.65 kg/m   Constitutional:  Well nourished. Alert and oriented, No acute distress. HEENT: [place] AT, mask in place.  Trachea midline Cardiovascular: No clubbing,  cyanosis, or edema. Respiratory: Normal respiratory effort, no increased work of breathing. GU: No  CVA tenderness.  No bladder fullness or masses.  Patient with circumcised phallus.  Urethral meatus is patent.  No penile discharge. No penile lesions or rashes. Scrotum without lesions, cysts, rashes and/or edema.  Testicles are located scrotally bilaterally.  They are atrophic.  No masses are appreciated in the testicles. Left and right epididymis are normal.  Bilateral varicoceles with left greater than right. Rectal: Patient with  normal sphincter tone. Anus and perineum without scarring or rashes. No rectal masses are appreciated. Prostate is approximately 45 grams, no nodules are appreciated. Seminal vesicles could not be palpated Lymph: No inguinal adenopathy. Neurologic: Grossly intact, no focal deficits, moving all 4 extremities. Psychiatric: Normal mood and affect.   Laboratory Data: Urinalysis Component     Latest Ref Rng & Units 05/27/2020  Specific Gravity, UA     1.005 - 1.030 1.025  pH, UA     5.0 - 7.5 7.0  Color, UA     Yellow Yellow  Appearance Ur     Clear Clear  Leukocytes,UA     Negative Negative  Protein,UA     Negative/Trace Negative  Glucose, UA     Negative Negative  Ketones, UA     Negative Negative  RBC, UA     Negative Negative  Bilirubin, UA     Negative Negative  Urobilinogen, Ur     0.2 - 1.0 mg/dL 0.2  Nitrite, UA     Negative Negative  Microscopic Examination      See below:   Component     Latest Ref Rng & Units 05/27/2020  WBC, UA     0 - 5 /hpf 0-5  Epithelial Cells (non renal)      CANCELED  Bacteria, UA     None seen/Few None seen   I have reviewed the labs.  Assessment & Plan:    1. BPH with LUTS - IPSS score is stable -Continue conservative management, avoiding bladder irritants and timed voiding's -Continue finasteride 5 mg daily - refills given  2. Bilateral varicoceles -Not bothersome -Continue to monitor  3. History  of elevated PSA -Stable  4.  Premature ejaculation -resolved  5.  Epididymal cyst -Not appreciated on today's exam  6. ED -Patient says he is satisfied at this time and does not desire any medication or further evaluation  Return in about 1 year (around 05/27/2021) for IPSS, SHIM, PSA and exam.  Zara Council, [facility]  [address], [place] [address] [phone]

## 2020-05-27 ENCOUNTER — Other Ambulatory Visit: Payer: Self-pay

## 2020-05-27 ENCOUNTER — Ambulatory Visit (INDEPENDENT_AMBULATORY_CARE_PROVIDER_SITE_OTHER): Payer: 59 | Admitting: Urology

## 2020-05-27 ENCOUNTER — Encounter: Payer: Self-pay | Admitting: Urology

## 2020-05-27 VITALS — BP 148/87 | HR 74 | Ht 68.0 in | Wt 195.0 lb

## 2020-05-27 DIAGNOSIS — N138 Other obstructive and reflux uropathy: Secondary | ICD-10-CM | POA: Diagnosis not present

## 2020-05-27 DIAGNOSIS — N529 Male erectile dysfunction, unspecified: Secondary | ICD-10-CM

## 2020-05-27 DIAGNOSIS — R972 Elevated prostate specific antigen [PSA]: Secondary | ICD-10-CM | POA: Diagnosis not present

## 2020-05-27 DIAGNOSIS — N401 Enlarged prostate with lower urinary tract symptoms: Secondary | ICD-10-CM

## 2020-05-27 LAB — MICROSCOPIC EXAMINATION: Bacteria, UA: NONE SEEN

## 2020-05-27 MED ORDER — FINASTERIDE 5 MG PO TABS: 5.0000 mg | ORAL_TABLET | Freq: Every day | ORAL | 3 refills | Status: DC

## 2020-05-28 LAB — URINALYSIS, COMPLETE
Bilirubin, UA: NEGATIVE
Glucose, UA: NEGATIVE
Ketones, UA: NEGATIVE
Leukocytes,UA: NEGATIVE
Nitrite, UA: NEGATIVE
Protein,UA: NEGATIVE
RBC, UA: NEGATIVE
Specific Gravity, UA: 1.025 (ref 1.005–1.030)
Urobilinogen, Ur: 0.2 mg/dL (ref 0.2–1.0)
pH, UA: 7 (ref 5.0–7.5)

## 2020-07-15 ENCOUNTER — Telehealth: Payer: Self-pay

## 2020-07-15 ENCOUNTER — Other Ambulatory Visit: Payer: Self-pay

## 2020-07-15 MED ORDER — CLENPIQ 10-3.5-12 MG-GM -GM/160ML PO SOLN: 320.0000 mL | ORAL | 0 refills | Status: AC

## 2020-07-15 MED ORDER — CLENPIQ 10-3.5-12 MG-GM -GM/160ML PO SOLN: 320.0000 mL | ORAL | 0 refills | Status: DC

## 2020-07-15 NOTE — Telephone Encounter (Signed)
Gastroenterology Pre-Procedure Review  Request Date: 08/22/20 Requesting Physician: Dr. Vicente Males  PATIENT REVIEW QUESTIONS: The patient responded to the following health history questions as indicated:    1. Are you having any GI issues? no 2. Do you have a personal history of Polyps? yes (2019) 3. Do you have a family history of Colon Cancer or Polyps? no 4. Diabetes Mellitus? no 5. Joint replacements in the past 12 months?no 6. Major health problems in the past 3 months?no 7. Any artificial heart valves, MVP, or defibrillator?no    MEDICATIONS & ALLERGIES:    Patient reports the following regarding taking any anticoagulation/antiplatelet therapy:   Plavix, Coumadin, Eliquis, Xarelto, Lovenox, Pradaxa, Brilinta, or Effient? no Aspirin? no  Patient confirms/reports the following medications:  Current Outpatient Medications  Medication Sig Dispense Refill  . amLODipine-olmesartan (AZOR) 10-40 MG tablet Take 1 tablet by mouth daily. 90 tablet 0  . atorvastatin (LIPITOR) 20 MG tablet     . diclofenac (VOLTAREN) 75 MG EC tablet Take 75 mg by mouth 2 (two) times daily.    Marland Kitchen dicyclomine (BENTYL) 20 MG tablet     . finasteride (PROSCAR) 5 MG tablet Take 1 tablet (5 mg total) by mouth daily. 90 tablet 3  . hydrochlorothiazide (HYDRODIURIL) 25 MG tablet Take 1 tablet (25 mg total) by mouth daily. 90 tablet 0  . omeprazole (PRILOSEC) 40 MG capsule Take 1 capsule (40 mg total) by mouth daily. 90 capsule 3  . potassium chloride (K-DUR) 10 MEQ tablet 1 BY MOUTH DAILY FOR LOW POTASSIUM    . rosuvastatin (CRESTOR) 5 MG tablet Take 1 tablet (5 mg total) by mouth at bedtime. 90 tablet 0   No current facility-administered medications for this visit.    Patient confirms/reports the following allergies:  No Known Allergies  No orders of the defined types were placed in this encounter.   AUTHORIZATION INFORMATION Primary Insurance: 1D#: Group #:  Secondary Insurance: 1D#: Group #:  SCHEDULE  INFORMATION: Date: 08/22/20 Time: Location: [place]

## 2021-05-22 ENCOUNTER — Other Ambulatory Visit: Payer: Self-pay

## 2021-05-22 ENCOUNTER — Other Ambulatory Visit: Payer: 59

## 2021-05-22 DIAGNOSIS — R972 Elevated prostate specific antigen [PSA]: Secondary | ICD-10-CM

## 2021-05-22 DIAGNOSIS — N138 Other obstructive and reflux uropathy: Secondary | ICD-10-CM

## 2021-05-23 LAB — PSA: Prostate Specific Ag, Serum: 1.4 ng/mL (ref 0.0–4.0)

## 2021-05-27 NOTE — Progress Notes (Signed)
05/28/21 ?8:50 AM  ? ?Devon Erickson ?December 17, 1960 ?[id number] ? ?Referring provider:  ?[facility] [place] [address],  [place] [address] ? ?Chief Complaint  ?Patient presents with  ? Benign Prostatic Hypertrophy  ? ? ?Urological history ?Elevated PSA ?- PSA trend  ? Prostate Specific Ag, Serum  ?Latest Ref Rng 0.0 - 4.0 ng/mL  ?01/13/2012 10.3 (H) started on finasteride on 03/09/12 after negative bx  ?06/15/2012 3.1  ?12/15/2012 1.6  ?06/13/2013 1.7  ?06/13/2014 1.9  ?11/24/2015 1.6  ?05/26/2016 1.6  ?11/26/2016 2.1   ?11/29/2016 2.0   ?02/25/2017 2.3   ?05/18/2017 1.2   ?11/01/2017 2.0   ?02/02/2018 1.3   ?05/03/2018 1.8   ?11/07/2018 1.7   ?05/08/2019 1.5   ?05/14/2020 1.7   ?05/22/2021 1.4   ? ?2. BPH with LUTS  ?- IPSS 0/0 ?- Managed with finasteride 5 mg daily  ?- Conservative management, avoiding bladder irritants and timed voiding's  ? ?3. ED  ?- SHIM 9 ?- contributing factors of age, BPH, HLD and HTN ? ?4. Premature ejaculation  ?- Resolved  ? ?5. Left epididymal cyst  ? ?6. Bilateral varicoceles  ? ? ?HPI: ?Devon Erickson is a 61 y.o.male who presents today for a 1 year follow-up with PSA prior.  ? ?He reports that after his prostate biopsy he started having trouble with his erections. He does not have spontaneous erections.  ? ?Patient denies any modifying or aggravating factors.  Patient denies any gross hematuria, dysuria or suprapubic/flank pain. Patient denies any fevers, chills, nausea or vomiting.  ? ? ? IPSS   ? ? [name] 05/28/21 0800  ?  ?  ?  ? International Prostate Symptom Score  ? How often have you had the sensation of not emptying your bladder? Not at All    ? How often have you had to urinate less than every two hours? Not at All    ? How often have you found you stopped and started again several times when you urinated? Not at All    ? How often have you found it difficult to postpone urination? Not at All    ? How often have you had a weak urinary stream? Not  at All    ? How often have you had to strain to start urination? Not at All    ? How many times did you typically get up at night to urinate? None    ? Total IPSS Score 0    ?  ? Quality of Life due to urinary symptoms  ? If you were to spend the rest of your life with your urinary condition just the way it is now how would you feel about that? Delighted    ? ?  ?  ? ?  ? ? ?Score:  ?1-7 Mild ?8-19 Moderate ?20-35 Severe ? ? SHIM   ? ? [name] 05/28/21 0818  ?  ?  ?  ? SHIM: Over the last 6 months:  ? How do you rate your confidence that you could get and keep an erection? Very Low    ? When you had erections with sexual stimulation, how often were your erections hard enough for penetration (entering your partner)? Almost Never or Never    ? During sexual intercourse, how often were you able to maintain your erection after you had penetrated (entered) your partner? Almost Never or Never    ? During sexual intercourse, how difficult was  it to maintain your erection to completion of intercourse? Extremely Difficult    ? When you attempted sexual intercourse, how often was it satisfactory for you? Almost Always or Always    ?  ? SHIM Total Score  ? SHIM 9    ? ?  ?  ? ?  ? ? ? ?PMH: ?Past Medical History:  ?Diagnosis Date  ? BPH (benign prostatic hyperplasia)   ? Elevated PSA   ? Erectile dysfunction   ? Hyperlipidemia   ? Hypertension   ? Hypogonadism in male   ? Metabolic syndrome   ? Nocturia   ? ? ?Surgical History: ?Past Surgical History:  ?Procedure Laterality Date  ? COLONOSCOPY WITH PROPOFOL N/A 08/22/2017  ? Procedure: COLONOSCOPY WITH PROPOFOL;  Surgeon: Jonathon Bellows, MD;  Location: [facility] ENDOSCOPY;  Service: Gastroenterology;  Laterality: N/A;  ? FOOT SURGERY Left   ? Left leg surgery    ? VASECTOMY    ? ? ?Home Medications:  ?Allergies as of 05/28/2021   ?No Known Allergies ?  ? ?  ?Medication List  ?  ? ?  ? Accurate as of May 28, 2021  8:50 AM. If you have any questions, ask your nurse or doctor.  ?  ?   ? ?  ? ?amLODipine-olmesartan 10-40 MG tablet ?Commonly known as: AZOR ?Take 1 tablet by mouth daily. ?  ?atorvastatin 20 MG tablet ?Commonly known as: LIPITOR ?  ?Clenpiq 10-3.5-12 MG-GM -GM/160ML Soln ?Generic drug: Sod Picosulfate-Mag Ox-Cit Acd ?Take 320 mLs by mouth as directed. ?  ?diclofenac 75 MG EC tablet ?Commonly known as: VOLTAREN ?Take 75 mg by mouth 2 (two) times daily. ?  ?dicyclomine 20 MG tablet ?Commonly known as: BENTYL ?  ?finasteride 5 MG tablet ?Commonly known as: PROSCAR ?Take 1 tablet (5 mg total) by mouth daily. ?  ?hydrochlorothiazide 25 MG tablet ?Commonly known as: HYDRODIURIL ?Take 1 tablet (25 mg total) by mouth daily. ?  ?omeprazole 40 MG capsule ?Commonly known as: PRILOSEC ?Take 1 capsule (40 mg total) by mouth daily. ?  ?potassium chloride 10 MEQ tablet ?Commonly known as: KLOR-CON ?1 BY MOUTH DAILY FOR LOW POTASSIUM ?  ?rosuvastatin 5 MG tablet ?Commonly known as: CRESTOR ?Take 1 tablet (5 mg total) by mouth at bedtime. ?  ? ?  ? ? ?Allergies:  ?No Known Allergies ? ?Family History: ?Family History  ?Problem Relation Age of Onset  ? Prostate cancer Neg Hx   ? Bladder Cancer Neg Hx   ? Kidney disease Neg Hx   ? Kidney cancer Neg Hx   ? ? ?Social History:  reports that he has quit smoking. He has quit using smokeless tobacco.  His smokeless tobacco use included chew. He reports that he does not drink alcohol and does not use drugs. ? ? ?Physical Exam: ?BP (!) 179/77   Pulse 65   Ht '5\' 8"'$  (1.727 m)   Wt 209 lb (94.8 kg)   BMI 31.78 kg/m?   ?Constitutional:  Alert and oriented, No acute distress. ?HEENT: [place] AT, moist mucus membranes.  Trachea midline ?Cardiovascular: No clubbing, cyanosis, or edema. ?Respiratory: Normal respiratory effort, no increased work of breathing. ?Neurologic: Grossly intact, no focal deficits, moving all 4 extremities. ?Psychiatric: Normal mood and affect. ? ? ?Laboratory Data: ?Component ?    Latest Ref Rng 05/22/2021  ?Prostate Specific Ag, Serum ?    0.0  - 4.0 ng/mL 1.4   ?I have reviewed the labs.  ? ?Assessment & Plan:   ?  BPH with LUTS  ?- PSA stable 1.4 (2.8) ?- Continue conservative management, avoiding bladder irritants and timed voiding's ?- Continue finasteride 5 mg daily - refills given ?- He deferred his rectal exam today  ? ?2. ED  ?- SHIM 9 ?- Patient says he is satisfied at this time and does not desire any medication or further evaluation ? ? ?Return in about 1 year (around 05/29/2022) for IPSS, SHIM, PSA and exam. ? ?[facility] ?[address] ?[place], [place] [address] ?[phone] ? ?I,Alfred Harrel,acting as a scribe for [employer], PA-C.,have documented all relevant documentation on the behalf of [place], PA-C,as directed by  [facility], PA-C while in the presence of [place], PA-C. ? ?I have reviewed the above documentation for accuracy and completeness, and I agree with the above.   ? ?Zara Council, PA-C  ?

## 2021-05-28 ENCOUNTER — Ambulatory Visit (INDEPENDENT_AMBULATORY_CARE_PROVIDER_SITE_OTHER): Payer: 59 | Admitting: Urology

## 2021-05-28 ENCOUNTER — Encounter: Payer: Self-pay | Admitting: Urology

## 2021-05-28 VITALS — BP 179/77 | HR 65 | Ht 68.0 in | Wt 209.0 lb

## 2021-05-28 DIAGNOSIS — N138 Other obstructive and reflux uropathy: Secondary | ICD-10-CM

## 2021-05-28 DIAGNOSIS — N401 Enlarged prostate with lower urinary tract symptoms: Secondary | ICD-10-CM

## 2021-05-28 DIAGNOSIS — N529 Male erectile dysfunction, unspecified: Secondary | ICD-10-CM | POA: Diagnosis not present

## 2021-05-28 MED ORDER — FINASTERIDE 5 MG PO TABS: 5.0000 mg | ORAL_TABLET | Freq: Every day | ORAL | 3 refills | Status: DC

## 2021-07-16 ENCOUNTER — Other Ambulatory Visit
Admission: RE | Admit: 2021-07-16 | Discharge: 2021-07-16 | Disposition: A | Discharge status: Home / Self Care | Payer: 59 | Source: Ambulatory Visit | Attending: Rheumatology | Admitting: Rheumatology

## 2021-07-16 DIAGNOSIS — M170 Bilateral primary osteoarthritis of knee: Secondary | ICD-10-CM | POA: Diagnosis present

## 2021-07-16 DIAGNOSIS — G8929 Other chronic pain: Secondary | ICD-10-CM | POA: Diagnosis not present

## 2021-07-16 DIAGNOSIS — M25561 Pain in right knee: Secondary | ICD-10-CM | POA: Diagnosis present

## 2021-07-16 DIAGNOSIS — M25461 Effusion, right knee: Secondary | ICD-10-CM | POA: Diagnosis present

## 2021-07-16 LAB — SYNOVIAL CELL COUNT + DIFF, W/ CRYSTALS
Crystals, Fluid: NONE SEEN
Eosinophils-Synovial: 1 %
Lymphocytes-Synovial Fld: 20 %
Monocyte-Macrophage-Synovial Fluid: 10 %
Neutrophil, Synovial: 69 %
WBC, Synovial: 272 /mm3 — ABNORMAL HIGH (ref 0–200)

## 2022-05-27 ENCOUNTER — Other Ambulatory Visit: Payer: 59

## 2022-05-27 ENCOUNTER — Encounter: Payer: Self-pay | Admitting: Urology

## 2022-05-31 NOTE — Progress Notes (Unsigned)
06/01/22 8:58 AM   [name] 1960-08-24 MD:[id number]  Referring provider:  [facility], [place],  [place] [address]  Chief Complaint  Patient presents with   Other    Urological history Elevated PSA - PSA pending - PSA (2013) 10.3 - negative biopsy - started on finasteride (2014)  2. BPH with LUTS  - finasteride 5 mg daily   3. ED  - contributing factors of age, BPH, HLD, history of smoking and HTN  4. Premature ejaculation  - Resolved   5. Left epididymal cyst   6. Bilateral varicoceles    HPI: Devon Erickson is a 62 y.o.male who presents today for a 1 year follow-up.  I PSS 0/0  He has no urinary complaints at this time.  Patient denies any modifying or aggravating factors.  Patient denies any gross hematuria, dysuria or suprapubic/flank pain.  Patient denies any fevers, chills, nausea or vomiting.    IPSS     Row Name 06/01/22 0800         International Prostate Symptom Score   How often have you had the sensation of not emptying your bladder? Not at All     How often have you had to urinate less than every two hours? Not at All     How often have you found you stopped and started again several times when you urinated? Not at All     How often have you found it difficult to postpone urination? Not at All     How often have you had a weak urinary stream? Not at All     How often have you had to strain to start urination? Not at All     How many times did you typically get up at night to urinate? None     Total IPSS Score 0       Quality of Life due to urinary symptoms   If you were to spend the rest of your life with your urinary condition just the way it is now how would you feel about that? Delighted               Score:  1-7 Mild 8-19 Moderate 20-35 Severe   SHIM 5  He is having penile fullness spontaneously, but the erection is not hard enough for intercourse and he loses it  quickly.  He denies any curvature or pain with erections.  He would like to try sildenafil at 100 mg on-demand dosing.  He denies any nitroglycerin medication.   SHIM     Row Name 06/01/22 0824         SHIM: Over the last 6 months:   How do you rate your confidence that you could get and keep an erection? Very Low     When you had erections with sexual stimulation, how often were your erections hard enough for penetration (entering your partner)? Almost Never or Never     During sexual intercourse, how often were you able to maintain your erection after you had penetrated (entered) your partner? Almost Never or Never     During sexual intercourse, how difficult was it to maintain your erection to completion of intercourse? Extremely Difficult     When you attempted sexual intercourse, how often was it satisfactory for you? Almost Never or Never       SHIM Total Score   SHIM 5  Score: 1-7 Severe ED 8-11 Moderate ED 12-16 Mild-Moderate ED 17-21 Mild ED 22-25 No ED   PMH: Past Medical History:  Diagnosis Date   BPH (benign prostatic hyperplasia)    Elevated PSA    Erectile dysfunction    Hyperlipidemia    Hypertension    Hypogonadism in male    Metabolic syndrome    Nocturia     Surgical History: Past Surgical History:  Procedure Laterality Date   COLONOSCOPY WITH PROPOFOL N/A 08/22/2017   Procedure: COLONOSCOPY WITH PROPOFOL;  Surgeon: Jonathon Bellows, MD;  Location: [facility] ENDOSCOPY;  Service: Gastroenterology;  Laterality: N/A;   FOOT SURGERY Left    Left leg surgery     VASECTOMY      Home Medications:  Allergies as of 06/01/2022   No Known Allergies      Medication List        Accurate as of June 01, 2022  8:58 AM. If you have any questions, ask your nurse or doctor.          amLODipine-olmesartan 10-40 MG tablet Commonly known as: AZOR Take 1 tablet by mouth daily.   atorvastatin 20 MG tablet Commonly known as: LIPITOR   Clenpiq  10-3.5-12 MG-GM -GM/160ML Soln Generic drug: Sod Picosulfate-Mag Ox-Cit Acd Take 320 mLs by mouth as directed.   diclofenac 75 MG EC tablet Commonly known as: VOLTAREN Take 75 mg by mouth 2 (two) times daily.   dicyclomine 20 MG tablet Commonly known as: BENTYL   finasteride 5 MG tablet Commonly known as: PROSCAR Take 1 tablet (5 mg total) by mouth daily.   hydrochlorothiazide 25 MG tablet Commonly known as: HYDRODIURIL Take 1 tablet (25 mg total) by mouth daily.   omeprazole 40 MG capsule Commonly known as: PRILOSEC Take 1 capsule (40 mg total) by mouth daily.   potassium chloride 10 MEQ tablet Commonly known as: KLOR-CON 1 BY MOUTH DAILY FOR LOW POTASSIUM   rosuvastatin 5 MG tablet Commonly known as: CRESTOR Take 1 tablet (5 mg total) by mouth at bedtime.   sildenafil 100 MG tablet Commonly known as: VIAGRA Take 1 tablet (100 mg total) by mouth daily as needed for erectile dysfunction. Started by: Zara Council, PA-C        Allergies:  No Known Allergies  Family History: Family History  Problem Relation Age of Onset   Prostate cancer Neg Hx    Bladder Cancer Neg Hx    Kidney disease Neg Hx    Kidney cancer Neg Hx     Social History:  reports that he has quit smoking. He has quit using smokeless tobacco.  His smokeless tobacco use included chew. He reports that he does not drink alcohol and does not use drugs.   Physical Exam: BP 130/83   Pulse (!) 54   Ht 5\' 7"  (1.702 m)   Wt 196 lb 9.6 oz (89.2 kg)   BMI 30.79 kg/m   Constitutional:  Well nourished. Alert and oriented, No acute distress. HEENT:  AT, moist mucus membranes.  Trachea midline Cardiovascular: No clubbing, cyanosis, or edema. Respiratory: Normal respiratory effort, no increased work of breathing. GU: No CVA tenderness.  No bladder fullness or masses.  Patient with circumcised phallus.  Urethral meatus is patent.  No penile discharge. No penile lesions or rashes. Scrotum without  lesions, cysts, rashes and/or edema.  Testicles are located scrotally bilaterally. No masses are appreciated in the testicles. Left and right epididymis are normal. Rectal: Patient with  normal sphincter tone. Anus  and perineum without scarring or rashes. No rectal masses are appreciated. Prostate is approximately 50 grams, no nodules are appreciated. Seminal vesicles could not be palpated.  Neurologic: Grossly intact, no focal deficits, moving all 4 extremities. Psychiatric: Normal mood and affect.   Laboratory Data: PSA pending Testosterone pending  I have reviewed the labs.   Assessment & Plan:    BPH with LUTS  - Continue conservative management, avoiding bladder irritants and timed voiding's - Continue finasteride 5 mg daily - refills given  2. ED  - Patient says he would like to try sildenafil 100 mg, on-demand-dosing-states he does not take any nitroglycerin products - Sildenafil 100 mg, 1 tablet two hours prior to intercourse on an empty stomach, # 30 - rx sent to pharmacy   Return in about 1 year (around 06/01/2023) for IPSS, SHIM, PSA and exam.  Zara Council, PA-C   [facility] [address] [place], [place] [address] [phone]

## 2022-06-01 ENCOUNTER — Ambulatory Visit (INDEPENDENT_AMBULATORY_CARE_PROVIDER_SITE_OTHER): Payer: 59 | Admitting: Urology

## 2022-06-01 ENCOUNTER — Encounter: Payer: Self-pay | Admitting: Urology

## 2022-06-01 VITALS — BP 130/83 | HR 54 | Ht 67.0 in | Wt 196.6 lb

## 2022-06-01 DIAGNOSIS — N529 Male erectile dysfunction, unspecified: Secondary | ICD-10-CM | POA: Diagnosis not present

## 2022-06-01 DIAGNOSIS — N401 Enlarged prostate with lower urinary tract symptoms: Secondary | ICD-10-CM | POA: Diagnosis not present

## 2022-06-01 MED ORDER — SILDENAFIL CITRATE 100 MG PO TABS
100.0000 mg | ORAL_TABLET | Freq: Every day | ORAL | 0 refills | Status: DC | PRN
Start: 2022-06-01 — End: 2023-06-23

## 2022-06-01 MED ORDER — FINASTERIDE 5 MG PO TABS: 5.0000 mg | ORAL_TABLET | Freq: Every day | ORAL | 3 refills | Status: DC

## 2022-06-02 ENCOUNTER — Telehealth: Payer: Self-pay | Admitting: Family Medicine

## 2022-06-02 LAB — TESTOSTERONE: Testosterone: 467 ng/dL (ref 264–916)

## 2022-06-02 LAB — PSA: Prostate Specific Ag, Serum: 1.3 ng/mL (ref 0.0–4.0)

## 2022-06-02 NOTE — Telephone Encounter (Signed)
Patient notified and voiced understanding.

## 2022-06-02 NOTE — Telephone Encounter (Signed)
-----   Message from Nori Riis, PA-C sent at 06/02/2022  8:12 AM EDT ----- Please let Devon Erickson know that his PSA and his testosterone level were normal.

## 2022-06-25 ENCOUNTER — Other Ambulatory Visit: Payer: Self-pay

## 2022-06-29 ENCOUNTER — Telehealth: Payer: Self-pay

## 2022-06-29 ENCOUNTER — Other Ambulatory Visit: Payer: Self-pay

## 2022-06-29 DIAGNOSIS — Z86010 Personal history of colonic polyps: Secondary | ICD-10-CM

## 2022-06-29 MED ORDER — NA SULFATE-K SULFATE-MG SULF 17.5-3.13-1.6 GM/177ML PO SOLN: 1.0000 | Freq: Once | ORAL | 0 refills | Status: AC

## 2022-06-29 NOTE — Telephone Encounter (Signed)
Gastroenterology Pre-Procedure Review  Request Date: 12/10/22 Requesting Physician: Dr. Tobi Bastos  PATIENT REVIEW QUESTIONS: The patient responded to the following health history questions as indicated:    1. Are you having any GI issues? no 2. Do you have a personal history of Polyps? yes (last colonoscopy performed by Dr. Tobi Bastos 08/22/2017) 3. Do you have a family history of Colon Cancer or Polyps? no 4. Diabetes Mellitus? no 5. Joint replacements in the past 12 months?no 6. Major health problems in the past 3 months?no 7. Any artificial heart valves, MVP, or defibrillator?no    MEDICATIONS & ALLERGIES:    Patient reports the following regarding taking any anticoagulation/antiplatelet therapy:   Plavix, Coumadin, Eliquis, Xarelto, Lovenox, Pradaxa, Brilinta, or Effient? no Aspirin? no  Patient confirms/reports the following medications:  Current Outpatient Medications  Medication Sig Dispense Refill   amLODipine-olmesartan (AZOR) 10-40 MG tablet Take 1 tablet by mouth daily. 90 tablet 0   atorvastatin (LIPITOR) 20 MG tablet      diclofenac (VOLTAREN) 75 MG EC tablet Take 75 mg by mouth 2 (two) times daily.     dicyclomine (BENTYL) 20 MG tablet      finasteride (PROSCAR) 5 MG tablet Take 1 tablet (5 mg total) by mouth daily. 90 tablet 3   hydrochlorothiazide (HYDRODIURIL) 25 MG tablet Take 1 tablet (25 mg total) by mouth daily. 90 tablet 0   omeprazole (PRILOSEC) 40 MG capsule Take 1 capsule (40 mg total) by mouth daily. 90 capsule 3   potassium chloride (K-DUR) 10 MEQ tablet 1 BY MOUTH DAILY FOR LOW POTASSIUM     rosuvastatin (CRESTOR) 5 MG tablet Take 1 tablet (5 mg total) by mouth at bedtime. 90 tablet 0   sildenafil (VIAGRA) 100 MG tablet Take 1 tablet (100 mg total) by mouth daily as needed for erectile dysfunction. 30 tablet 0   Sod Picosulfate-Mag Ox-Cit Acd (CLENPIQ) 10-3.5-12 MG-GM -GM/160ML SOLN Take 320 mLs by mouth as directed. 320 mL 0   No current facility-administered  medications for this visit.    Patient confirms/reports the following allergies:  No Known Allergies  No orders of the defined types were placed in this encounter.   AUTHORIZATION INFORMATION Primary Insurance: 1D#: Group #:  Secondary Insurance: 1D#: Group #:  SCHEDULE INFORMATION: Date: 12/10/22 Time: Location: [facility]

## 2022-12-10 ENCOUNTER — Ambulatory Visit: Payer: 59 | Admitting: Anesthesiology

## 2022-12-10 ENCOUNTER — Encounter
Admission: RE | Disposition: A | Discharge status: Home / Self Care | Payer: Self-pay | Source: Home / Self Care | Attending: Gastroenterology

## 2022-12-10 ENCOUNTER — Ambulatory Visit
Admission: RE | Admit: 2022-12-10 | Discharge: 2022-12-10 | Disposition: A | Discharge status: Home / Self Care | Payer: 59 | Attending: Gastroenterology | Admitting: Gastroenterology

## 2022-12-10 ENCOUNTER — Encounter: Payer: Self-pay | Admitting: Gastroenterology

## 2022-12-10 DIAGNOSIS — K5140 Inflammatory polyps of colon without complications: Secondary | ICD-10-CM | POA: Diagnosis not present

## 2022-12-10 DIAGNOSIS — I10 Essential (primary) hypertension: Secondary | ICD-10-CM | POA: Diagnosis not present

## 2022-12-10 DIAGNOSIS — Z1211 Encounter for screening for malignant neoplasm of colon: Secondary | ICD-10-CM | POA: Diagnosis not present

## 2022-12-10 DIAGNOSIS — D122 Benign neoplasm of ascending colon: Secondary | ICD-10-CM | POA: Diagnosis not present

## 2022-12-10 DIAGNOSIS — Z860100 Personal history of colon polyps, unspecified: Secondary | ICD-10-CM

## 2022-12-10 DIAGNOSIS — Z87891 Personal history of nicotine dependence: Secondary | ICD-10-CM | POA: Insufficient documentation

## 2022-12-10 DIAGNOSIS — D126 Benign neoplasm of colon, unspecified: Secondary | ICD-10-CM

## 2022-12-10 HISTORY — PX: COLONOSCOPY WITH PROPOFOL: SHX5780

## 2022-12-10 SURGERY — COLONOSCOPY WITH PROPOFOL
Anesthesia: General

## 2022-12-10 MED ORDER — PROPOFOL 500 MG/50ML IV EMUL
INTRAVENOUS | Status: DC | PRN
  Administered 2022-12-10: 140 ug/kg/min via INTRAVENOUS

## 2022-12-10 MED ORDER — LIDOCAINE HCL (CARDIAC) PF 100 MG/5ML IV SOSY
PREFILLED_SYRINGE | INTRAVENOUS | Status: DC | PRN
  Administered 2022-12-10: 60 mg via INTRAVENOUS

## 2022-12-10 MED ORDER — SODIUM CHLORIDE 0.9 % IV SOLN: INTRAVENOUS | Status: DC

## 2022-12-10 MED ORDER — PROPOFOL 10 MG/ML IV BOLUS
INTRAVENOUS | Status: DC | PRN
  Administered 2022-12-10: 60 mg via INTRAVENOUS

## 2022-12-10 NOTE — Transfer of Care (Signed)
Immediate Anesthesia Transfer of Care Note  Patient: Devon Erickson  Procedure(s) Performed: COLONOSCOPY WITH PROPOFOL  Patient Location: PACU  Anesthesia Type:General  Level of Consciousness: awake, alert , and oriented  Airway & Oxygen Therapy: Patient Spontanous Breathing  Post-op Assessment: Report given to RN and Post -op Vital signs reviewed and stable  Post vital signs: Reviewed and stable  Last Vitals:  Vitals Value Taken Time  BP    Temp    Pulse    Resp    SpO2      Last Pain:  Vitals:   12/10/22 0841  TempSrc: Temporal         Complications: No notable events documented.

## 2022-12-10 NOTE — Anesthesia Postprocedure Evaluation (Signed)
Anesthesia Post Note  Patient: Devon Erickson  Procedure(s) Performed: COLONOSCOPY WITH PROPOFOL  Patient location during evaluation: PACU Anesthesia Type: General Level of consciousness: awake and awake and alert Pain management: satisfactory to patient Vital Signs Assessment: post-procedure vital signs reviewed and stable Respiratory status: patient connected to face mask oxygen and spontaneous breathing Cardiovascular status: stable Anesthetic complications: no   No notable events documented.   Last Vitals:  Vitals:   12/10/22 0956 12/10/22 1002  BP: 131/89 131/74  Pulse: 71 (!) 58  Resp:    Temp:    SpO2: 99% 99%    Last Pain:  Vitals:   12/10/22 1002  TempSrc:   PainSc: 0-No pain                 VAN STAVEREN,Dacari Beckstrand

## 2022-12-10 NOTE — Anesthesia Preprocedure Evaluation (Signed)
Anesthesia Evaluation  Patient identified by MRN, date of birth, ID band Patient awake    Reviewed: Allergy & Precautions, NPO status , Patient's Chart, lab work & pertinent test results  Airway Mallampati: II  TM Distance: >3 FB Neck ROM: Full    Dental  (+) Teeth Intact, Chipped,    Pulmonary neg pulmonary ROS, Patient abstained from smoking., former smoker   Pulmonary exam normal breath sounds clear to auscultation       Cardiovascular Exercise Tolerance: Good hypertension, Pt. on medications negative cardio ROS Normal cardiovascular exam Rhythm:Regular Rate:Normal     Neuro/Psych negative neurological ROS  negative psych ROS   GI/Hepatic negative GI ROS, Neg liver ROS,,,  Endo/Other  negative endocrine ROS    Renal/GU negative Renal ROS  negative genitourinary   Musculoskeletal   Abdominal  (+) + obese  Peds negative pediatric ROS (+)  Hematology negative hematology ROS (+)   Anesthesia Other Findings Past Medical History: No date: BPH (benign prostatic hyperplasia) No date: Elevated PSA No date: Erectile dysfunction No date: Hyperlipidemia No date: Hypertension No date: Hypogonadism in male No date: Metabolic syndrome No date: Nocturia  Past Surgical History: 08/22/2017: COLONOSCOPY WITH PROPOFOL; N/A     Comment:  Procedure: COLONOSCOPY WITH PROPOFOL;  Surgeon: Wyline Mood, MD;  Location: [facility] ENDOSCOPY;  Service:               Gastroenterology;  Laterality: N/A; No date: FOOT SURGERY; Left No date: Left leg surgery No date: VASECTOMY  BMI    Body Mass Index: 30.29 kg/m      Reproductive/Obstetrics negative OB ROS                             Anesthesia Physical Anesthesia Plan  ASA: 3  Anesthesia Plan: General   Post-op Pain Management:    Induction: Intravenous  PONV Risk Score and Plan: Propofol infusion and TIVA  Airway Management Planned:  Natural Airway  Additional Equipment:   Intra-op Plan:   Post-operative Plan:   Informed Consent: I have reviewed the patients History and Physical, chart, labs and discussed the procedure including the risks, benefits and alternatives for the proposed anesthesia with the patient or authorized representative who has indicated his/her understanding and acceptance.     Dental Advisory Given  Plan Discussed with: Anesthesiologist, CRNA and Surgeon  Anesthesia Plan Comments:        Anesthesia Quick Evaluation

## 2022-12-10 NOTE — H&P (Signed)
Wyline Mood, MD [address], [address], [place], [place], [address] [address], [address], [place], [place], [address] Phone: [phone]  Fax: [phone]  Primary Care Physician:  Leroy Kennedy, PA-C   Pre-Procedure History & Physical: HPI:  Devon Erickson is a 62 y.o. male is here for an colonoscopy.   Past Medical History:  Diagnosis Date   BPH (benign prostatic hyperplasia)    Elevated PSA    Erectile dysfunction    Hyperlipidemia    Hypertension    Hypogonadism in male    Metabolic syndrome    Nocturia     Past Surgical History:  Procedure Laterality Date   COLONOSCOPY WITH PROPOFOL N/A 08/22/2017   Procedure: COLONOSCOPY WITH PROPOFOL;  Surgeon: Wyline Mood, MD;  Location: [facility] ENDOSCOPY;  Service: Gastroenterology;  Laterality: N/A;   FOOT SURGERY Left    Left leg surgery     VASECTOMY      Prior to Admission medications   Medication Sig Start Date End Date Taking? Authorizing Provider  amLODipine-olmesartan (AZOR) 10-40 MG tablet Take 1 tablet by mouth daily. 11/11/16  Yes Ellyn Hack, MD  atorvastatin (LIPITOR) 20 MG tablet  05/06/17  Yes [provider]  finasteride (PROSCAR) 5 MG tablet Take 1 tablet (5 mg total) by mouth daily. 06/01/22  Yes McGowan, Carollee Herter A, PA-C  hydrochlorothiazide (HYDRODIURIL) 25 MG tablet Take 1 tablet (25 mg total) by mouth daily. 06/09/16  Yes Ellyn Hack, MD  omeprazole (PRILOSEC) 40 MG capsule Take 1 capsule (40 mg total) by mouth daily. 07/05/19  Yes Wyline Mood, MD  potassium chloride (K-DUR) 10 MEQ tablet 1 BY MOUTH DAILY FOR LOW POTASSIUM 04/08/18  Yes [provider]  diclofenac (VOLTAREN) 75 MG EC tablet Take 75 mg by mouth 2 (two) times daily. 05/21/20   [provider]  dicyclomine (BENTYL) 20 MG tablet  06/25/19   [provider]  rosuvastatin (CRESTOR) 5 MG tablet Take 1 tablet (5 mg total) by mouth at bedtime. 11/11/16   Ellyn Hack, MD  sildenafil (VIAGRA) 100 MG  tablet Take 1 tablet (100 mg total) by mouth daily as needed for erectile dysfunction. 06/01/22   McGowan, Carollee Herter A, PA-C  Sod Picosulfate-Mag Ox-Cit Acd (CLENPIQ) 10-3.5-12 MG-GM -GM/160ML SOLN Take 320 mLs by mouth as directed. 07/15/20   Wyline Mood, MD    Allergies as of 06/29/2022   (No Known Allergies)    Family History  Problem Relation Age of Onset   Prostate cancer Neg Hx    Bladder Cancer Neg Hx    Kidney disease Neg Hx    Kidney cancer Neg Hx     Social History   Socioeconomic History   Marital status: Married    Spouse name: Not on file   Number of children: Not on file   Years of education: Not on file   Highest education level: Not on file  Occupational History   Not on file  Tobacco Use   Smoking status: Former   Smokeless tobacco: Former    Types: Chew   Tobacco comments:    quit 1996  Vaping Use   Vaping status: Never Used  Substance and Sexual Activity   Alcohol use: No    Alcohol/week: 0.0 standard drinks of alcohol   Drug use: No   Sexual activity: Yes  Other Topics Concern   Not on file  Social History Narrative   Caffeine 1 serving daily.   Social Determinants of Health  Financial Resource Strain: Not on file  Food Insecurity: Not on file  Transportation Needs: Not on file  Physical Activity: Not on file  Stress: Not on file  Social Connections: Not on file  Intimate Partner Violence: Not on file    Review of Systems: See HPI, otherwise negative ROS  Physical Exam: BP (!) 143/77   Pulse (!) 57   Temp (!) 96.6 F (35.9 C) (Temporal)   Resp 18   Wt 87.7 kg   SpO2 98%   BMI 30.29 kg/m  General:   Alert,  pleasant and cooperative in NAD Head:  Normocephalic and atraumatic. Neck:  Supple; no masses or thyromegaly. Lungs:  Clear throughout to auscultation, normal respiratory effort.    Heart:  +S1, +S2, Regular rate and rhythm, No edema. Abdomen:  Soft, nontender and nondistended. Normal bowel sounds, without guarding, and  without rebound.   Neurologic:  Alert and  oriented x4;  grossly normal neurologically.  Impression/Plan: Devon Erickson is here for an colonoscopy to be performed for surveillance due to prior history of colon polyps   Risks, benefits, limitations, and alternatives regarding  colonoscopy have been reviewed with the patient.  Questions have been answered.  All parties agreeable.   Wyline Mood, MD  12/10/2022, 9:16 AM

## 2022-12-10 NOTE — Op Note (Signed)
[facility] Gastroenterology Patient Name: Devon Erickson Procedure Date: 12/10/2022 9:20 AM MRN: [id number] Account #: [id number] Date of Birth: 28-Jan-1961 Admit Type: Outpatient Age: 62 Room: [facility] ENDO ROOM 4 Gender: Male Note Status: Finalized Instrument Name: Prentice Docker [id number] Procedure:             Colonoscopy Indications:           Surveillance: Personal history of adenomatous polyps                         on last colonoscopy > 3 years ago Providers:             Wyline Mood MD, MD Referring MD:          Wyline Mood MD, MD (Referring MD), No Local Md, MD                         (Referring MD) Medicines:             Monitored Anesthesia Care Complications:         No immediate complications. Procedure:             Pre-Anesthesia Assessment:                        - Prior to the procedure, a History and Physical was                         performed, and patient medications, allergies and                         sensitivities were reviewed. The patient's tolerance                         of previous anesthesia was reviewed.                        - The risks and benefits of the procedure and the                         sedation options and risks were discussed with the                         patient. All questions were answered and informed                         consent was obtained.                        - ASA Grade Assessment: II - A patient with mild                         systemic disease.                        After obtaining informed consent, the colonoscope was                         passed under direct vision. Throughout the procedure,  the patient's blood pressure, pulse, and oxygen                         saturations were monitored continuously. The                         Colonoscope was introduced through the anus and                         advanced to the the cecum, identified by the                          appendiceal orifice. The colonoscopy was performed                         with ease. The patient tolerated the procedure well.                         The quality of the bowel preparation was excellent.                         The ileocecal valve, appendiceal orifice, and rectum                         were photographed. Findings:      The perianal and digital rectal examinations were normal.      Two sessile polyps were found in the ascending colon. The polyps were 4       to 5 mm in size. These polyps were removed with a cold snare. Resection       and retrieval were complete.      The exam was otherwise without abnormality on direct and retroflexion       views. Impression:            - Two 4 to 5 mm polyps in the ascending colon, removed                         with a cold snare. Resected and retrieved.                        - The examination was otherwise normal on direct and                         retroflexion views. Recommendation:        - Discharge patient to home (with escort).                        - Resume previous diet.                        - Continue present medications.                        - Await pathology results.                        - Repeat colonoscopy in 5 years for surveillance. Procedure Code(s):     --- Professional ---  40981, Colonoscopy, flexible; with removal of                         tumor(s), polyp(s), or other lesion(s) by snare                         technique Diagnosis Code(s):     --- Professional ---                        Z86.010, Personal history of colonic polyps                        D12.2, Benign neoplasm of ascending colon CPT copyright 2022 American Medical Association. All rights reserved. The codes documented in this report are preliminary and upon coder review may  be revised to meet current compliance requirements. Wyline Mood, MD Wyline Mood MD, MD 12/10/2022 9:41:16 AM This report has been signed  electronically. Number of Addenda: 0 Note Initiated On: 12/10/2022 9:20 AM Scope Withdrawal Time: 0 hours 8 minutes 33 seconds  Total Procedure Duration: 0 hours 13 minutes 40 seconds  Estimated Blood Loss:  Estimated blood loss: none.      [facility]

## 2022-12-13 ENCOUNTER — Encounter: Payer: Self-pay | Admitting: Gastroenterology

## 2022-12-13 LAB — SURGICAL PATHOLOGY

## 2022-12-16 ENCOUNTER — Encounter: Payer: Self-pay | Admitting: Gastroenterology

## 2023-06-20 ENCOUNTER — Other Ambulatory Visit: Payer: Self-pay

## 2023-06-20 DIAGNOSIS — N138 Other obstructive and reflux uropathy: Secondary | ICD-10-CM

## 2023-06-21 ENCOUNTER — Other Ambulatory Visit: Payer: Self-pay

## 2023-06-21 DIAGNOSIS — N138 Other obstructive and reflux uropathy: Secondary | ICD-10-CM

## 2023-06-22 LAB — PSA: Prostate Specific Ag, Serum: 1.4 ng/mL (ref 0.0–4.0)

## 2023-06-22 NOTE — Progress Notes (Unsigned)
 06/23/23 3:41 PM   Devon Erickson 1960/05/05 [id number]  Referring provider:  [facility] [address] [place],  [place] [address]  Urological history Elevated PSA - PSA (05/2023) 1.4  2.8 - PSA (2013) 10.3 - negative biopsy - started on finasteride  (2014)  2. BPH with LUTS  - finasteride  5 mg daily   3. ED   4. Premature ejaculation  - Resolved   5. Left epididymal cyst   6. Bilateral varicoceles   HPI: Devon Erickson is a 63 y.o.male who presents today for a 1 year follow-up.  Previous records reviewed.     I PSS 0/1  He has no urinary complaints.  Patient denies any modifying or aggravating factors.  Patient denies any recent UTI's, gross hematuria, dysuria or suprapubic/flank pain.  Patient denies any fevers, chills, nausea or vomiting.      IPSS     Row Name 06/23/23 1500         International Prostate Symptom Score   How often have you had the sensation of not emptying your bladder? Not at All     How often have you had to urinate less than every two hours? Not at All     How often have you found you stopped and started again several times when you urinated? Not at All     How often have you found it difficult to postpone urination? Not at All     How often have you had a weak urinary stream? Not at All     How often have you had to strain to start urination? Not at All     How many times did you typically get up at night to urinate? None     Total IPSS Score 0       Quality of Life due to urinary symptoms   If you were to spend the rest of your life with your urinary condition just the way it is now how would you feel about that? Pleased              Score:  1-7 Mild 8-19 Moderate 20-35 Severe    SHIM 9   Patient is not having spontaneous erections.  He denies any pain or curvature with erections.   He states the sildenafil  gave him headache, but it was not effective.   SHIM     Row Name  06/23/23 1529         SHIM: Over the last 6 months:   How do you rate your confidence that you could get and keep an erection? Very Low     When you had erections with sexual stimulation, how often were your erections hard enough for penetration (entering your partner)? Almost Never or Never     During sexual intercourse, how often were you able to maintain your erection after you had penetrated (entered) your partner? Almost Never or Never     During sexual intercourse, how difficult was it to maintain your erection to completion of intercourse? Extremely Difficult     When you attempted sexual intercourse, how often was it satisfactory for you? Almost Always or Always       SHIM Total Score   SHIM 9              Score: 1-7 Severe ED 8-11 Moderate ED 12-16 Mild-Moderate ED 17-21 Mild ED 22-25 No ED   PMH: Past Medical History:  Diagnosis Date  BPH (benign prostatic hyperplasia)    Elevated PSA    Erectile dysfunction    Hyperlipidemia    Hypertension    Hypogonadism in male    Metabolic syndrome    Nocturia     Surgical History: Past Surgical History:  Procedure Laterality Date   COLONOSCOPY WITH PROPOFOL  N/A 08/22/2017   Procedure: COLONOSCOPY WITH PROPOFOL ;  Surgeon: Luke Salaam, MD;  Location: [facility] ENDOSCOPY;  Service: Gastroenterology;  Laterality: N/A;   COLONOSCOPY WITH PROPOFOL  N/A 12/10/2022   Procedure: COLONOSCOPY WITH PROPOFOL ;  Surgeon: Luke Salaam, MD;  Location: [facility] ENDOSCOPY;  Service: Gastroenterology;  Laterality: N/A;   FOOT SURGERY Left    Left leg surgery     VASECTOMY      Home Medications:  Allergies as of 06/23/2023   No Known Allergies      Medication List        Accurate as of June 23, 2023  3:41 PM. If you have any questions, ask your nurse or doctor.          STOP taking these medications    sildenafil  100 MG tablet Commonly known as: VIAGRA        TAKE these medications    amLODipine -olmesartan  10-40 MG  tablet Commonly known as: AZOR  Take 1 tablet by mouth daily.   atorvastatin 20 MG tablet Commonly known as: LIPITOR   Clenpiq  10-3.5-12 MG-GM -GM/160ML Soln Generic drug: Sod Picosulfate-Mag Ox-Cit Acd Take 320 mLs by mouth as directed.   diclofenac 75 MG EC tablet Commonly known as: VOLTAREN Take 75 mg by mouth 2 (two) times daily.   dicyclomine 20 MG tablet Commonly known as: BENTYL   finasteride  5 MG tablet Commonly known as: PROSCAR  Take 1 tablet (5 mg total) by mouth daily.   hydrochlorothiazide  25 MG tablet Commonly known as: HYDRODIURIL  Take 1 tablet (25 mg total) by mouth daily.   omeprazole  40 MG capsule Commonly known as: PRILOSEC Take 1 capsule (40 mg total) by mouth daily.   potassium chloride 10 MEQ tablet Commonly known as: KLOR-CON 1 BY MOUTH DAILY FOR LOW POTASSIUM   rosuvastatin  5 MG tablet Commonly known as: CRESTOR  Take 1 tablet (5 mg total) by mouth at bedtime.        Allergies:  No Known Allergies  Family History: Family History  Problem Relation Age of Onset   Prostate cancer Neg Hx    Bladder Cancer Neg Hx    Kidney disease Neg Hx    Kidney cancer Neg Hx     Social History:  reports that he has quit smoking. He has quit using smokeless tobacco.  His smokeless tobacco use included chew. He reports that he does not drink alcohol and does not use drugs.   Physical Exam: BP (!) 470/90   Pulse 77   Constitutional:  Well nourished. Alert and oriented, No acute distress. HEENT: [place] AT, moist mucus membranes.  Trachea midline Cardiovascular: No clubbing, cyanosis, or edema. Respiratory: Normal respiratory effort, no increased work of breathing. Patient opted out of GU/Rectal exam  Neurologic: Grossly intact, no focal deficits, moving all 4 extremities. Psychiatric: Normal mood and affect.   Laboratory Data: Results for orders placed or performed in visit on 06/21/23  PSA   Collection Time: 06/21/23  8:24 AM  Result Value Ref Range    Prostate Specific Ag, Serum 1.4 0.0 - 4.0 ng/mL  I have reviewed the labs.   Assessment & Plan:    BPH with LUTS  - Continue conservative management, avoiding  bladder irritants and timed voiding's - Continue finasteride  5 mg daily - refills given  2. ED  - His tadalafil  gave him a headache and was not effective -We discussed other treatment options such as ICI and IPP, but he deferred  Return in about 1 year (around 06/22/2024) for PSA, I PSS .  Matilde Son, PA-C   [facility] [address], [address] [place], [place] [address] [phone]

## 2023-06-23 ENCOUNTER — Encounter: Payer: Self-pay | Admitting: Urology

## 2023-06-23 ENCOUNTER — Ambulatory Visit (INDEPENDENT_AMBULATORY_CARE_PROVIDER_SITE_OTHER): Payer: Self-pay | Admitting: Urology

## 2023-06-23 VITALS — BP 170/90 | HR 77

## 2023-06-23 DIAGNOSIS — N138 Other obstructive and reflux uropathy: Secondary | ICD-10-CM

## 2023-06-23 DIAGNOSIS — N401 Enlarged prostate with lower urinary tract symptoms: Secondary | ICD-10-CM | POA: Diagnosis not present

## 2023-06-23 DIAGNOSIS — N529 Male erectile dysfunction, unspecified: Secondary | ICD-10-CM | POA: Diagnosis not present

## 2023-06-23 MED ORDER — FINASTERIDE 5 MG PO TABS: 5.0000 mg | ORAL_TABLET | Freq: Every day | ORAL | 3 refills | Status: AC

## 2023-06-24 ENCOUNTER — Ambulatory Visit: Payer: Self-pay | Admitting: Urology

## 2024-06-18 ENCOUNTER — Other Ambulatory Visit

## 2024-06-21 ENCOUNTER — Ambulatory Visit: Admitting: Urology
